# Patient Record
Sex: Female | Born: 2000 | Hispanic: Yes | Marital: Married | Smoking: Never smoker
Health system: Southern US, Community
[De-identification: ages and names within clinical notes are randomized; demographics above are authoritative.]

---

## 2019-11-29 DIAGNOSIS — Z8759 Personal history of other complications of pregnancy, childbirth and the puerperium: Secondary | ICD-10-CM | POA: Insufficient documentation

## 2020-09-28 DIAGNOSIS — Z3490 Encounter for supervision of normal pregnancy, unspecified, unspecified trimester: Secondary | ICD-10-CM | POA: Insufficient documentation

## 2021-03-15 ENCOUNTER — Encounter (HOSPITAL_COMMUNITY): Payer: Self-pay | Admitting: Emergency Medicine

## 2021-03-15 ENCOUNTER — Other Ambulatory Visit: Payer: Self-pay

## 2021-03-15 ENCOUNTER — Emergency Department (HOSPITAL_COMMUNITY): Payer: Medicaid Other

## 2021-03-15 ENCOUNTER — Inpatient Hospital Stay (HOSPITAL_COMMUNITY)
Admission: EM | Admit: 2021-03-15 | Discharge: 2021-03-17 | DRG: 818 | Disposition: A | Discharge status: Home / Self Care | Payer: Medicaid Other | Attending: Obstetrics and Gynecology | Admitting: Obstetrics and Gynecology

## 2021-03-15 DIAGNOSIS — Z7982 Long term (current) use of aspirin: Secondary | ICD-10-CM

## 2021-03-15 DIAGNOSIS — Z9181 History of falling: Secondary | ICD-10-CM

## 2021-03-15 DIAGNOSIS — W19XXXA Unspecified fall, initial encounter: Secondary | ICD-10-CM

## 2021-03-15 DIAGNOSIS — S82209A Unspecified fracture of shaft of unspecified tibia, initial encounter for closed fracture: Secondary | ICD-10-CM | POA: Diagnosis present

## 2021-03-15 DIAGNOSIS — W010XXA Fall on same level from slipping, tripping and stumbling without subsequent striking against object, initial encounter: Secondary | ICD-10-CM | POA: Diagnosis present

## 2021-03-15 DIAGNOSIS — Z20822 Contact with and (suspected) exposure to covid-19: Secondary | ICD-10-CM | POA: Diagnosis present

## 2021-03-15 DIAGNOSIS — T148XXA Other injury of unspecified body region, initial encounter: Secondary | ICD-10-CM

## 2021-03-15 DIAGNOSIS — Z419 Encounter for procedure for purposes other than remedying health state, unspecified: Secondary | ICD-10-CM

## 2021-03-15 DIAGNOSIS — O9A213 Injury, poisoning and certain other consequences of external causes complicating pregnancy, third trimester: Principal | ICD-10-CM | POA: Diagnosis present

## 2021-03-15 DIAGNOSIS — R109 Unspecified abdominal pain: Secondary | ICD-10-CM | POA: Diagnosis present

## 2021-03-15 DIAGNOSIS — S82143A Displaced bicondylar fracture of unspecified tibia, initial encounter for closed fracture: Secondary | ICD-10-CM

## 2021-03-15 DIAGNOSIS — S82145A Nondisplaced bicondylar fracture of left tibia, initial encounter for closed fracture: Secondary | ICD-10-CM | POA: Diagnosis present

## 2021-03-15 DIAGNOSIS — Z3A34 34 weeks gestation of pregnancy: Secondary | ICD-10-CM

## 2021-03-15 DIAGNOSIS — O368130 Decreased fetal movements, third trimester, not applicable or unspecified: Secondary | ICD-10-CM | POA: Diagnosis present

## 2021-03-15 LAB — ABO/RH: ABO/RH(D): B POS

## 2021-03-15 MED ORDER — ACETAMINOPHEN 500 MG PO TABS
1000.0000 mg | ORAL_TABLET | Freq: Once | ORAL | Status: AC
  Administered 2021-03-15: 1000 mg via ORAL
  Filled 2021-03-15: qty 2

## 2021-03-15 NOTE — ED Notes (Signed)
Trauma Response Nurse Note-  Reason for Call / Reason for Trauma activation:   - Level 2 Fall while being [redacted] weeks pregnant. With pain to the abdomen, right hip and left knee with decreased fetal movements.  Initial Focused Assessment (If applicable, or please see trauma documentation):  -Pt is alert and oriented. No airways obstruction noted, breathing is non-labored with symmetrical chest rise and fall. No external bleeding noted. GCS 15  Interventions:  -x-rays of the pelvic and left leg obtained. Imaging obtained at bedside. OB rapid responce at bedside. CT scans not ordered at this time.   Plan of Care as of this note:  -Waiting on results of x-rays. OB to continue to monitor  Event Summary:   - Pt came in as a fall, [redacted] weeks pregnant with pain to the right hip, left knee and abdomen. X-rays obtained. OB rapid response at bedside. No CT scans have been ordered as of  Now.   The Following (if applicable):    -MD notified: Dr. Karene Fry

## 2021-03-15 NOTE — ED Triage Notes (Signed)
Pt reported to ED after experiencing fall this evening. Reports pain to left knee and lower abdominal pain 6/10. Also reports decreased fetal movements since fall.

## 2021-03-15 NOTE — Progress Notes (Signed)
   03/15/21 2300  Clinical Encounter Type  Visited With Patient not available  Visit Type Trauma  Referral From Nurse  Consult/Referral To Chaplain  The chaplain responded to Trauma page. The patient is being assessed. No family is present. No chaplain services are needed at this time. The chaplain will follow up as needed.

## 2021-03-15 NOTE — ED Provider Notes (Signed)
[facility] EMERGENCY DEPARTMENT Provider Note   CSN: [id number] Arrival date & time: 03/15/21  2219     History Chief Complaint  Patient presents with   Fall   Abdominal Pain    Alexandra Cole is a 20 y.o. female.   Fall This is a new problem. The current episode started less than 1 hour ago. The problem occurs constantly. Associated symptoms include abdominal pain. Pertinent negatives include no chest pain, no headaches and no shortness of breath. She has tried nothing for the symptoms.  Abdominal Pain Pain location:  Generalized Pain quality: aching and sharp   Pain radiates to:  Does not radiate Pain severity:  Moderate Onset quality:  Sudden Timing:  Constant Chronicity:  New Associated symptoms: no chest pain, no chills, no cough, no dysuria, no fever, no hematuria, no shortness of breath, no sore throat, no vaginal bleeding and no vomiting    20 year old G1 P0 female at [redacted] weeks gestational age presenting to the emergency department after mechanical ground-level fall.  The patient states that she tripped and lost her balance while holding one of her children, falling onto her right side.  She denies any head trauma or loss of consciousness.  She states that she twisted her left knee when she fell and sustained immediate pain and swelling and subsequent difficulty with range of motion.  She has been unable to bear weight since the accident.  She was transported to [facility] health where she arrived GCS 15, ABC intact as a level 2 trauma.  She endorsed left knee pain, right hip pain and generalized abdominal pain.  OB rapid response was bedside for fetal heart tracing.  History reviewed. No pertinent past medical history.  Patient Active Problem List   Diagnosis Date Noted   Status post fall 03/16/2021   Supervision of normal pregnancy 09/28/2020   History of pre-eclampsia 11/29/2019     History reviewed. No pertinent surgical history.   OB  History     Gravida  2   Para      Term      Preterm      AB      Living  1      SAB      IAB      Ectopic      Multiple      Live Births              History reviewed. No pertinent family history.  Social History   Tobacco Use   Smoking status: Never   Smokeless tobacco: Never  Vaping Use   Vaping Use: Never used  Substance Use Topics   Alcohol use: Not Currently   Drug use: Not Currently    Home Medications Prior to Admission medications   Medication Sig Start Date End Date Taking? Authorizing Provider  aspirin 81 MG chewable tablet Chew 81 mg by mouth daily.   Yes [provider]  ondansetron (ZOFRAN) 4 MG tablet Take 4 mg by mouth every 8 (eight) hours as needed. 11/01/20   [provider]    Allergies    Patient has no allergy information on record.  Review of Systems   Review of Systems  Constitutional:  Negative for chills and fever.  HENT:  Negative for ear pain and sore throat.   Eyes:  Negative for pain and visual disturbance.  Respiratory:  Negative for cough and shortness of breath.   Cardiovascular:  Negative for chest pain and palpitations.  Gastrointestinal:  Positive for abdominal pain. Negative for vomiting.  Genitourinary:  Negative for dysuria, hematuria and vaginal bleeding.  Musculoskeletal:  Positive for arthralgias and joint swelling. Negative for back pain.  Skin:  Negative for color change and rash.  Neurological:  Negative for seizures, syncope and headaches.  All other systems reviewed and are negative.  Physical Exam Updated Vital Signs BP 113/64   Pulse 79   Temp 97.9 F (36.6 C) (Oral)   Resp 18   Ht 5\' 2"  (1.575 m)   Wt 55.8 kg   SpO2 97%   BMI 22.50 kg/m   Physical Exam Vitals and nursing note reviewed.  Constitutional:      General: She is not in acute distress.    Appearance: She is well-developed.  HENT:     Head: Normocephalic and atraumatic.     Comments: No midline  tenderness to the cervical spine, range of motion intact Eyes:     Conjunctiva/sclera: Conjunctivae normal.  Cardiovascular:     Rate and Rhythm: Normal rate and regular rhythm.     Heart sounds: No murmur heard. Pulmonary:     Effort: Pulmonary effort is normal. No respiratory distress.     Breath sounds: Normal breath sounds.  Abdominal:     Palpations: Abdomen is soft.     Tenderness: There is abdominal tenderness. There is no guarding or rebound.     Comments: Gravid uterus, generalized tenderness to palpation, worse in the epigastrium and at the uterine fundus  Musculoskeletal:        General: Swelling, tenderness and signs of injury present.     Cervical back: Neck supple.     Comments: No midline tenderness of the thoracic or lumbar spine.  Left knee with decreased range of motion, lateral tenderness to palpation with an obvious joint effusion present.  Distally neurovascularly intact.  Skin:    General: Skin is warm and dry.     Capillary Refill: Capillary refill takes less than 2 seconds.  Neurological:     General: No focal deficit present.     Mental Status: She is alert. Mental status is at baseline.    ED Results / Procedures / Treatments   Labs (all labs ordered are listed, but only abnormal results are displayed) Labs Reviewed  CBC - Abnormal; Notable for the following components:      Result Value   RBC 3.54 (*)    Hemoglobin 10.3 (*)    HCT 30.1 (*)    All other components within normal limits  RESP PANEL BY RT-PCR (FLU A&B, COVID) ARPGX2  TYPE AND SCREEN  ABO/RH    EKG None  Radiology DG Knee 1-2 Views Left  Result Date: 03/16/2021 CLINICAL DATA:  Fall this evening.  Left knee pain. EXAM: LEFT KNEE - 1-2 VIEW COMPARISON:  Tibia and femur radiograph earlier today. FINDINGS: Again seen depressed fracture of the lateral tibial plateau. Approximately 4-5 mm articular depression laterally. Fracture extends to the tibial spines. No convincing metaphyseal  component. There is a large lipohemarthrosis. IMPRESSION: Depressed lateral tibial plateau fracture with approximately 4-5 mm articular depression. Electronically Signed   By: 03/18/2021 M.D.   On: 03/16/2021 01:12   CT KNEE LEFT WO CONTRAST  Result Date: 03/16/2021 CLINICAL DATA:  Left knee fracture. EXAM: CT OF THE LEFT KNEE WITHOUT CONTRAST TECHNIQUE: Multidetector CT imaging of the LEFT knee was performed according to the standard protocol. Multiplanar CT image reconstructions were also generated. COMPARISON:  03/16/2021 RADIOGRAPHS FINDINGS:  Bones/Joint/Cartilage There is a split depression fracture of the lateral tibial plateau with up to 4 mm of articular surface step-off along the lateral fracture margin. Fracture line extends into the intercondylar eminence. The split fracture component is seen extending into the proximal tibiofibular joint. Additional subcortical impaction fracture of the medial tibial plateau is noted as well. Distal femur is intact. Lipohemarthrosis is noted. Ligaments Suboptimally assessed by CT. Thickening and hazy stranding about the ACL likely reflecting some degree of tearing. PCL appears intact though the tibial attachment site is closely approximated by the tibial plateau fracture line. Thickening, stranding and laxity along the lateral supporting structures. Medial supporting structures appear intact with some minimal bowing secondary to fluid in the medial recess. Muscles and Tendons Anterior bowing of the extensor mechanism secondary to the presence of the effusion and lipohemarthrosis. Extensor mechanism otherwise remains grossly intact. No discontinuity along the medial or lateral retinaculum. No other conspicuous musculotendinous abnormalities are seen. Soft tissues Soft tissue swelling of the knee. Effusion lipohemarthrosis as above. IMPRESSION: Split depressed fracture of the lateral tibial plateau with extension into the intercondylar eminence and fracture line  extension into the proximal tibiofibular joint. Additional subcortical impaction fracture of the medial tibial plateau. Lipohemarthrosis. Thickening, laxity of the lateral supporting structures of the knee concerning for ligamentous injury. Additional stranding and irregularity of the ACL. PCL appears intact though the tibial attachment is closely approximated by the fracture line. Electronically Signed   By: Price  DeHay M.D.   On: 03/16/2021 02:47   DGKreg [place] Pelvis Portable  Result Date: 03/15/2021 CLINICAL DATA:  20 year old female with trauma. EXAM: LEFT FEMUR PORTABLE 1 VIEW; PORTABLE LEFT TIBIA AND FIBULA - 2 VIEW; PORTABLE PELVIS 1-2 VIEWS COMPARISON:  None. FINDINGS: There is a depressed fracture of the lateral tibial plateau. No other acute fracture. There is no dislocation. There is a suprapatellar lipohemarthrosis. No arthritic changes. Fetal skeleton over the lower abdomen and pelvis in cephalic position. IMPRESSION: Depressed fracture of the lateral tibial plateau. No dislocation. Electronically Signed   By: Elgie CollardArash  Radparvar M.D.   On: 03/15/2021 23:33   DG Tibia/Fibula Left Port  Result Date: 03/15/2021 CLINICAL DATA:  20 year old female with trauma. EXAM: LEFT FEMUR PORTABLE 1 VIEW; PORTABLE LEFT TIBIA AND FIBULA - 2 VIEW; PORTABLE PELVIS 1-2 VIEWS COMPARISON:  None. FINDINGS: There is a depressed fracture of the lateral tibial plateau. No other acute fracture. There is no dislocation. There is a suprapatellar lipohemarthrosis. No arthritic changes. Fetal skeleton over the lower abdomen and pelvis in cephalic position. IMPRESSION: Depressed fracture of the lateral tibial plateau. No dislocation. Electronically Signed   By: Elgie CollardArash  Radparvar M.D.   On: 03/15/2021 23:33   DG FEMUR PORT 1V LEFT  Result Date: 03/15/2021 CLINICAL DATA:  [age] year old female with trauma. EXAM: LEFT FEMUR PORTABLE 1 VIEW; PORTABLE LEFT TIBIA AND FIBULA - 2 VIEW; PORTABLE PELVIS 1-2 VIEWS COMPARISON:  None. FINDINGS: There  is a depressed fracture of the lateral tibial plateau. No other acute fracture. There is no dislocation. There is a suprapatellar lipohemarthrosis. No arthritic changes. Fetal skeleton over the lower abdomen and pelvis in cephalic position. IMPRESSION: Depressed fracture of the lateral tibial plateau. No dislocation. Electronically Signed   By: Elgie CollardArash  Radparvar M.D.   On: 03/15/2021 23:33    Procedures Procedures   Medications Ordered in ED Medications  aspirin chewable tablet 81 mg ( Oral Automatically Held 03/24/21 1000)  sodium chloride flush (NS) 0.9 % injection 3 mL ( Intravenous Automatically Held 03/24/21  2200)  sodium chloride flush (NS) 0.9 % injection 3 mL ( Intravenous MAR Hold 03/16/21 1051)  0.9 %  sodium chloride infusion ( Intravenous MAR Hold 03/16/21 1051)  acetaminophen (TYLENOL) tablet 650 mg ( Oral MAR Hold 03/16/21 1051)  zolpidem (AMBIEN) tablet 5 mg ( Oral MAR Hold 03/16/21 1051)  docusate sodium (COLACE) capsule 100 mg ( Oral Automatically Held 03/24/21 1000)  prenatal multivitamin tablet 1 tablet ( Oral Automatically Held 03/24/21 1200)  calcium carbonate (TUMS - dosed in mg elemental calcium) chewable tablet 400 mg of elemental calcium ( Oral MAR Hold 03/16/21 1051)  oxyCODONE (Oxy IR/ROXICODONE) immediate release tablet 5 mg ( Oral MAR Hold 03/16/21 1051)  HYDROmorphone (DILAUDID) injection 0.5 mg ( Intravenous MAR Hold 03/16/21 1051)  lactated ringers infusion ( Intravenous New Bag/Given 03/16/21 1114)  lactated ringers infusion (has no administration in time range)  chlorhexidine (PERIDEX) 0.12 % solution (has no administration in time range)  0.9 % irrigation (POUR BTL) (1,000 mLs Irrigation Given 03/16/21 1226)  acetaminophen (TYLENOL) tablet 1,000 mg (1,000 mg Oral Given 03/15/21 2326)  sodium citrate-citric acid (ORACIT) solution 30 mL (30 mLs Oral Given 03/16/21 1113)  chlorhexidine (PERIDEX) 0.12 % solution 15 mL (15 mLs Mouth/Throat Given 03/16/21 1114)    Or  MEDLINE mouth  rinse ( Mouth Rinse See Alternative 03/16/21 1114)    ED Course  I have reviewed the triage vital signs and the nursing notes.  Pertinent labs & imaging results that were available during my care of the patient were reviewed by me and considered in my medical decision making (see chart for details).    MDM Rules/Calculators/A&P                           20 year old G1 P0 female at [redacted] weeks gestational age presenting to the emergency department after a mechanical ground-level fall.  She presented as a level 2 trauma and arrived GCS 15, ABC intact.  Fetal heart tracing was performed on arrival with fetal heart tones noted in the 150s.  The patient describes right hip pain, left knee pain with joint swelling and decreased range of motion, generalized abdominal discomfort.  No vaginal bleeding.  The left lower extremity was found to be neurovascularly intact.  Concern for possible fracture.  Will obtain x-ray imaging and reassess.  Since abdomen was gravid, mildly tender to palpation but nonperitoneal take.  We will hold off on CT imaging given the patient's hemodynamic stability.  X-ray imaging concerning for a lateral tibial plateau fracture.  Orthopedic consulted for further recommendations.  At time of signout, orthopedics recommendations were pending.  Plan for likely admission for serial abdominal exams in the setting of abdominal trauma and intrauterine pregnancy at [redacted] weeks gestational age.  Signout provided to Dr. Nicanor Alcon at [phone].  Final Clinical Impression(s) / ED Diagnoses Final diagnoses:  Fall  Closed fracture of tibial plateau, unspecified laterality, initial encounter  [redacted] weeks gestation of pregnancy    Rx / DC Orders ED Discharge Orders     None        Ernie Avena, MD 03/16/21 1240

## 2021-03-15 NOTE — Progress Notes (Signed)
[facility] RN called to [facility] for 20 yo G2P1 GA34.1, Level 2 Trauma s/p fall and hit stomach. Patient complains of abdominal pain, pelvic pain and left leg pain.   [facility] RN arrived and applied u/s monitor to patiens abdomen, FHR 155. Patients denies any sharp pain TO URQ, delines headache, blurry vision, seeing spots in front of eyes. Patient denies any vaginal bleeding or leaking of fluid. Patients abdomen is soft to palpation.   Lovenia Shuck, RN

## 2021-03-15 NOTE — Progress Notes (Signed)
Pollux.Rosser RN spoke with Dr. Almira Bar Attending and provided report and triage information. Orders received that fetus is to be monitored for at least 4 hours. As long as patient is cleared from main ED, ok to move patient to MAU for monitoring.   Lovenia Shuck, RN

## 2021-03-15 NOTE — Progress Notes (Signed)
Orthopedic Tech Progress Note Patient Details:  Alexandra Cole [birth year] [id number]  Level 2 trauma   Patient ID: Alexandra Cole, female   DOB: 2001-01-11, 20 y.o.   MRN: [id number]  Donald Pore 03/15/2021, 10:44 PM

## 2021-03-16 ENCOUNTER — Observation Stay (HOSPITAL_COMMUNITY): Payer: Medicaid Other

## 2021-03-16 ENCOUNTER — Emergency Department (HOSPITAL_COMMUNITY): Payer: Medicaid Other

## 2021-03-16 ENCOUNTER — Observation Stay (HOSPITAL_COMMUNITY): Payer: Medicaid Other | Admitting: Certified Registered"

## 2021-03-16 ENCOUNTER — Encounter (HOSPITAL_COMMUNITY)
Admission: EM | Disposition: A | Discharge status: Home / Self Care | Payer: Self-pay | Source: Home / Self Care | Attending: Family Medicine

## 2021-03-16 ENCOUNTER — Encounter (HOSPITAL_COMMUNITY): Payer: Self-pay | Admitting: Emergency Medicine

## 2021-03-16 DIAGNOSIS — O9A213 Injury, poisoning and certain other consequences of external causes complicating pregnancy, third trimester: Secondary | ICD-10-CM | POA: Diagnosis not present

## 2021-03-16 DIAGNOSIS — Z20822 Contact with and (suspected) exposure to covid-19: Secondary | ICD-10-CM | POA: Diagnosis present

## 2021-03-16 DIAGNOSIS — Z3493 Encounter for supervision of normal pregnancy, unspecified, third trimester: Secondary | ICD-10-CM | POA: Diagnosis not present

## 2021-03-16 DIAGNOSIS — S82142A Displaced bicondylar fracture of left tibia, initial encounter for closed fracture: Secondary | ICD-10-CM

## 2021-03-16 DIAGNOSIS — Z09 Encounter for follow-up examination after completed treatment for conditions other than malignant neoplasm: Secondary | ICD-10-CM | POA: Diagnosis not present

## 2021-03-16 DIAGNOSIS — O368130 Decreased fetal movements, third trimester, not applicable or unspecified: Secondary | ICD-10-CM | POA: Diagnosis present

## 2021-03-16 DIAGNOSIS — Z9181 History of falling: Secondary | ICD-10-CM

## 2021-03-16 DIAGNOSIS — W010XXA Fall on same level from slipping, tripping and stumbling without subsequent striking against object, initial encounter: Secondary | ICD-10-CM | POA: Diagnosis present

## 2021-03-16 DIAGNOSIS — Z7982 Long term (current) use of aspirin: Secondary | ICD-10-CM | POA: Diagnosis not present

## 2021-03-16 DIAGNOSIS — Z3A34 34 weeks gestation of pregnancy: Secondary | ICD-10-CM

## 2021-03-16 DIAGNOSIS — Z9889 Other specified postprocedural states: Secondary | ICD-10-CM | POA: Diagnosis not present

## 2021-03-16 DIAGNOSIS — S82145A Nondisplaced bicondylar fracture of left tibia, initial encounter for closed fracture: Secondary | ICD-10-CM | POA: Diagnosis present

## 2021-03-16 DIAGNOSIS — Z3480 Encounter for supervision of other normal pregnancy, unspecified trimester: Secondary | ICD-10-CM | POA: Diagnosis not present

## 2021-03-16 DIAGNOSIS — R109 Unspecified abdominal pain: Secondary | ICD-10-CM | POA: Diagnosis not present

## 2021-03-16 DIAGNOSIS — W1839XA Other fall on same level, initial encounter: Secondary | ICD-10-CM | POA: Diagnosis not present

## 2021-03-16 DIAGNOSIS — S82143A Displaced bicondylar fracture of unspecified tibia, initial encounter for closed fracture: Secondary | ICD-10-CM

## 2021-03-16 HISTORY — PX: ORIF TIBIA PLATEAU: SHX2132

## 2021-03-16 LAB — RESP PANEL BY RT-PCR (FLU A&B, COVID) ARPGX2
Influenza A by PCR: NEGATIVE
Influenza B by PCR: NEGATIVE
SARS Coronavirus 2 by RT PCR: NEGATIVE

## 2021-03-16 LAB — TYPE AND SCREEN
ABO/RH(D): B POS
Antibody Screen: NEGATIVE

## 2021-03-16 LAB — CBC
HCT: 30.1 % — ABNORMAL LOW (ref 36.0–46.0)
Hemoglobin: 10.3 g/dL — ABNORMAL LOW (ref 12.0–15.0)
MCH: 29.1 pg (ref 26.0–34.0)
MCHC: 34.2 g/dL (ref 30.0–36.0)
MCV: 85 fL (ref 80.0–100.0)
Platelets: 172 10*3/uL (ref 150–400)
RBC: 3.54 MIL/uL — ABNORMAL LOW (ref 3.87–5.11)
RDW: 13.3 % (ref 11.5–15.5)
WBC: 10.5 10*3/uL (ref 4.0–10.5)
nRBC: 0 % (ref 0.0–0.2)

## 2021-03-16 SURGERY — OPEN REDUCTION INTERNAL FIXATION (ORIF) TIBIAL PLATEAU
Anesthesia: Spinal | Laterality: Left

## 2021-03-16 MED ORDER — DOCUSATE SODIUM 100 MG PO CAPS
100.0000 mg | ORAL_CAPSULE | Freq: Every day | ORAL | Status: DC
  Administered 2021-03-17: 100 mg via ORAL
  Filled 2021-03-16: qty 1

## 2021-03-16 MED ORDER — VANCOMYCIN HCL 1000 MG IV SOLR
INTRAVENOUS | Status: DC | PRN
  Administered 2021-03-16: 1000 mg

## 2021-03-16 MED ORDER — VANCOMYCIN HCL 1000 MG IV SOLR
INTRAVENOUS | Status: AC
  Filled 2021-03-16: qty 1000

## 2021-03-16 MED ORDER — SODIUM CHLORIDE 0.9% FLUSH: 3.0000 mL | INTRAVENOUS | Status: DC | PRN

## 2021-03-16 MED ORDER — LIDOCAINE 2% (20 MG/ML) 5 ML SYRINGE
INTRAMUSCULAR | Status: AC
  Filled 2021-03-16: qty 5

## 2021-03-16 MED ORDER — CEFAZOLIN SODIUM-DEXTROSE 2-3 GM-%(50ML) IV SOLR
INTRAVENOUS | Status: DC | PRN
  Administered 2021-03-16: 2 g via INTRAVENOUS

## 2021-03-16 MED ORDER — LIDOCAINE HCL (CARDIAC) PF 100 MG/5ML IV SOSY
PREFILLED_SYRINGE | INTRAVENOUS | Status: DC | PRN
  Administered 2021-03-16: 50 mg via INTRATRACHEAL

## 2021-03-16 MED ORDER — BUPIVACAINE IN DEXTROSE 0.75-8.25 % IT SOLN
INTRATHECAL | Status: DC | PRN
  Administered 2021-03-16: 1.6 mL via INTRATHECAL

## 2021-03-16 MED ORDER — ONDANSETRON HCL 4 MG/2ML IJ SOLN: 4.0000 mg | Freq: Once | INTRAMUSCULAR | Status: DC | PRN

## 2021-03-16 MED ORDER — CALCIUM CARBONATE ANTACID 500 MG PO CHEW: 2.0000 | CHEWABLE_TABLET | ORAL | Status: DC | PRN

## 2021-03-16 MED ORDER — FENTANYL CITRATE (PF) 250 MCG/5ML IJ SOLN
INTRAMUSCULAR | Status: DC | PRN
  Administered 2021-03-16: 50 ug via INTRAVENOUS

## 2021-03-16 MED ORDER — BUPIVACAINE HCL (PF) 0.25 % IJ SOLN
INTRAMUSCULAR | Status: AC
  Filled 2021-03-16: qty 30

## 2021-03-16 MED ORDER — LACTATED RINGERS IV SOLN
INTRAVENOUS | Status: DC | PRN
Start: 2021-03-16 — End: 2021-03-16

## 2021-03-16 MED ORDER — OXYCODONE HCL 5 MG PO TABS
5.0000 mg | ORAL_TABLET | ORAL | Status: DC | PRN
Start: 2021-03-16 — End: 2021-03-17
  Administered 2021-03-16 – 2021-03-17 (×6): 5 mg via ORAL
  Filled 2021-03-16 (×6): qty 1

## 2021-03-16 MED ORDER — OXYCODONE HCL 5 MG PO TABS: 5.0000 mg | ORAL_TABLET | Freq: Once | ORAL | Status: DC | PRN

## 2021-03-16 MED ORDER — ONDANSETRON HCL 4 MG/2ML IJ SOLN
INTRAMUSCULAR | Status: DC | PRN
  Administered 2021-03-16: 4 mg via INTRAVENOUS

## 2021-03-16 MED ORDER — FENTANYL CITRATE (PF) 100 MCG/2ML IJ SOLN
INTRAMUSCULAR | Status: AC
  Filled 2021-03-16: qty 2

## 2021-03-16 MED ORDER — ORAL CARE MOUTH RINSE: 15.0000 mL | Freq: Once | OROMUCOSAL | Status: AC

## 2021-03-16 MED ORDER — CHLORHEXIDINE GLUCONATE 0.12 % MT SOLN
15.0000 mL | Freq: Once | OROMUCOSAL | Status: AC
  Administered 2021-03-16: 15 mL via OROMUCOSAL

## 2021-03-16 MED ORDER — PROPOFOL 500 MG/50ML IV EMUL
INTRAVENOUS | Status: DC | PRN
  Administered 2021-03-16: 150 ug/kg/min via INTRAVENOUS

## 2021-03-16 MED ORDER — PHENYLEPHRINE HCL-NACL 10-0.9 MG/250ML-% IV SOLN
INTRAVENOUS | Status: DC | PRN
  Administered 2021-03-16: 50 ug/min via INTRAVENOUS

## 2021-03-16 MED ORDER — ZOLPIDEM TARTRATE 5 MG PO TABS: 5.0000 mg | ORAL_TABLET | Freq: Every evening | ORAL | Status: DC | PRN

## 2021-03-16 MED ORDER — FENTANYL CITRATE (PF) 100 MCG/2ML IJ SOLN
25.0000 ug | INTRAMUSCULAR | Status: DC | PRN
  Administered 2021-03-16: 25 ug via INTRAVENOUS

## 2021-03-16 MED ORDER — LACTATED RINGERS IV SOLN: INTRAVENOUS | Status: DC

## 2021-03-16 MED ORDER — 0.9 % SODIUM CHLORIDE (POUR BTL) OPTIME
TOPICAL | Status: DC | PRN
  Administered 2021-03-16: 1000 mL

## 2021-03-16 MED ORDER — ACETAMINOPHEN 325 MG PO TABS
650.0000 mg | ORAL_TABLET | ORAL | Status: DC | PRN
  Administered 2021-03-16: 650 mg via ORAL
  Filled 2021-03-16: qty 2

## 2021-03-16 MED ORDER — HYDROMORPHONE HCL 1 MG/ML IJ SOLN
0.5000 mg | INTRAMUSCULAR | Status: DC | PRN
Start: 2021-03-16 — End: 2021-03-17
  Administered 2021-03-16 – 2021-03-17 (×6): 0.5 mg via INTRAVENOUS
  Filled 2021-03-16 (×6): qty 0.5

## 2021-03-16 MED ORDER — CHLORHEXIDINE GLUCONATE 0.12 % MT SOLN
OROMUCOSAL | Status: AC
  Filled 2021-03-16: qty 15

## 2021-03-16 MED ORDER — OXYCODONE HCL 5 MG/5ML PO SOLN
5.0000 mg | Freq: Once | ORAL | Status: DC | PRN
Start: 2021-03-16 — End: 2021-03-16

## 2021-03-16 MED ORDER — ONDANSETRON HCL 4 MG/2ML IJ SOLN
INTRAMUSCULAR | Status: AC
  Filled 2021-03-16: qty 2

## 2021-03-16 MED ORDER — ENOXAPARIN SODIUM 40 MG/0.4ML IJ SOSY
40.0000 mg | PREFILLED_SYRINGE | INTRAMUSCULAR | Status: DC
  Administered 2021-03-17: 40 mg via SUBCUTANEOUS
  Filled 2021-03-16 (×2): qty 0.4

## 2021-03-16 MED ORDER — ASPIRIN 81 MG PO CHEW
81.0000 mg | CHEWABLE_TABLET | Freq: Every day | ORAL | Status: DC
  Administered 2021-03-17: 81 mg via ORAL
  Filled 2021-03-16: qty 1

## 2021-03-16 MED ORDER — PRENATAL MULTIVITAMIN CH
1.0000 | ORAL_TABLET | Freq: Every day | ORAL | Status: DC
  Administered 2021-03-17: 1 via ORAL
  Filled 2021-03-16: qty 1

## 2021-03-16 MED ORDER — SODIUM CHLORIDE 0.9 % IV SOLN: 250.0000 mL | INTRAVENOUS | Status: DC | PRN

## 2021-03-16 MED ORDER — SOD CITRATE-CITRIC ACID 500-334 MG/5ML PO SOLN
30.0000 mL | Freq: Once | ORAL | Status: AC
  Administered 2021-03-16: 30 mL via ORAL
  Filled 2021-03-16: qty 30

## 2021-03-16 MED ORDER — FENTANYL CITRATE (PF) 250 MCG/5ML IJ SOLN
INTRAMUSCULAR | Status: AC
  Filled 2021-03-16: qty 5

## 2021-03-16 MED ORDER — SODIUM CHLORIDE 0.9% FLUSH
3.0000 mL | Freq: Two times a day (BID) | INTRAVENOUS | Status: DC
  Administered 2021-03-16 – 2021-03-17 (×3): 3 mL via INTRAVENOUS

## 2021-03-16 MED ORDER — CEFAZOLIN SODIUM 1 G IJ SOLR
INTRAMUSCULAR | Status: AC
  Filled 2021-03-16: qty 20

## 2021-03-16 SURGICAL SUPPLY — 72 items
BAG COUNTER SPONGE SURGICOUNT (BAG) ×2 IMPLANT
BANDAGE ESMARK 6X9 LF (GAUZE/BANDAGES/DRESSINGS) ×2 IMPLANT
BIT DRILL 2.5MM SMALL QC EVOS (BIT) ×1 IMPLANT
BIT DRILL QC 2.5MM SHRT EVO SM (DRILL) ×1 IMPLANT
BLADE CLIPPER SURG (BLADE) IMPLANT
BNDG COHESIVE 6X5 TAN STRL LF (GAUZE/BANDAGES/DRESSINGS) ×2 IMPLANT
BNDG ELASTIC 6X10 VLCR STRL LF (GAUZE/BANDAGES/DRESSINGS) ×2 IMPLANT
BNDG ELASTIC 6X5.8 VLCR STR LF (GAUZE/BANDAGES/DRESSINGS) ×2 IMPLANT
BNDG ESMARK 6X9 LF (GAUZE/BANDAGES/DRESSINGS) ×4
COOLER ICEMAN CLASSIC (MISCELLANEOUS) ×2 IMPLANT
COVER SURGICAL LIGHT HANDLE (MISCELLANEOUS) ×2 IMPLANT
DRAPE C-ARM 42X72 X-RAY (DRAPES) ×2 IMPLANT
DRAPE C-ARMOR (DRAPES) ×2 IMPLANT
DRAPE HALF SHEET 40X57 (DRAPES) ×2 IMPLANT
DRAPE IMP U-DRAPE 54X76 (DRAPES) ×4 IMPLANT
DRAPE INCISE IOBAN 66X45 STRL (DRAPES) ×2 IMPLANT
DRAPE ORTHO SPLIT 77X108 STRL (DRAPES) ×2
DRAPE SURG ORHT 6 SPLT 77X108 (DRAPES) ×2 IMPLANT
DRAPE U-SHAPE 47X51 STRL (DRAPES) ×2 IMPLANT
DRILL 2.5MM SMALL QC EVOS (BIT) ×2
DRILL QC 2.5MM SHORT EVOS SM (DRILL) ×2
DRSG PAD ABDOMINAL 8X10 ST (GAUZE/BANDAGES/DRESSINGS) ×2 IMPLANT
DRSG XEROFORM 1X8 (GAUZE/BANDAGES/DRESSINGS) ×2 IMPLANT
DURAPREP 26ML APPLICATOR (WOUND CARE) ×2 IMPLANT
ELECT REM PT RETURN 9FT ADLT (ELECTROSURGICAL) ×2
ELECTRODE REM PT RTRN 9FT ADLT (ELECTROSURGICAL) ×1 IMPLANT
FACESHIELD STD STERILE (MASK) ×2 IMPLANT
GAUZE SPONGE 4X4 12PLY STRL LF (GAUZE/BANDAGES/DRESSINGS) ×2 IMPLANT
GAUZE XEROFORM 1X8 LF (GAUZE/BANDAGES/DRESSINGS) ×2 IMPLANT
GLOVE SURG LTX SZ7 (GLOVE) ×2 IMPLANT
GLOVE SURG NEOP MICRO LF SZ7.5 (GLOVE) ×2 IMPLANT
GLOVE SURG SYN 7.5  E (GLOVE) ×1
GLOVE SURG SYN 7.5 E (GLOVE) ×1 IMPLANT
GLOVE SURG UNDER POLY LF SZ7 (GLOVE) ×10 IMPLANT
GOWN STRL REIN XL XLG (GOWN DISPOSABLE) ×6 IMPLANT
K-WIRE 1.6 (WIRE) ×2
K-WIRE FX150X1.6XTROC PNT (WIRE) ×2
KIT BASIN OR (CUSTOM PROCEDURE TRAY) ×2 IMPLANT
KIT TURNOVER KIT B (KITS) ×2 IMPLANT
KWIRE FX150X1.6XTROC PNT (WIRE) ×2 IMPLANT
MANIFOLD NEPTUNE II (INSTRUMENTS) ×2 IMPLANT
NDL SUT 6 .5 CRC .975X.05 MAYO (NEEDLE) IMPLANT
NEEDLE HYPO 25GX1X1/2 BEV (NEEDLE) ×2 IMPLANT
NEEDLE MAYO TAPER (NEEDLE)
NS IRRIG 1000ML POUR BTL (IV SOLUTION) ×2 IMPLANT
PACK GENERAL/GYN (CUSTOM PROCEDURE TRAY) ×2 IMPLANT
PAD ABD 8X10 STRL (GAUZE/BANDAGES/DRESSINGS) ×2 IMPLANT
PAD ARMBOARD 7.5X6 YLW CONV (MISCELLANEOUS) ×4 IMPLANT
PAD COLD SHLDR WRAP-ON (PAD) ×2 IMPLANT
PLATE LPT EVOS 3.5X70 4H LT (Plate) ×2 IMPLANT
PUTTY DBX 2.5CC (Putty) ×2 IMPLANT
PUTTY DBX 2.5CC DEPUY (Putty) ×1 IMPLANT
SCREW CANC PT EVOS 4.7X65 (Screw) ×2 IMPLANT
SCREW CTX 3.5X34MM EVOS (Screw) ×2 IMPLANT
SCREW CTX 3.5X36MM EVOS (Screw) ×2 IMPLANT
SCREW LOCK 3.5X60MM EVOS (Screw) ×4 IMPLANT
SCREW LOCK 3.5X65MM (Screw) ×4 IMPLANT
STAPLER VISISTAT 35W (STAPLE) IMPLANT
STOCKINETTE IMPERVIOUS 9X36 MD (GAUZE/BANDAGES/DRESSINGS) ×2 IMPLANT
SUCTION FRAZIER HANDLE 10FR (MISCELLANEOUS) ×1
SUCTION TUBE FRAZIER 10FR DISP (MISCELLANEOUS) ×1 IMPLANT
SUT ETHILON 2 0 FS 18 (SUTURE) ×2 IMPLANT
SUT ETHILON 3 0 PS 1 (SUTURE) ×4 IMPLANT
SUT PDS AB 0 CT 36 (SUTURE) ×4 IMPLANT
SUT VIC AB 0 CT1 27 (SUTURE) ×1
SUT VIC AB 0 CT1 27XBRD ANBCTR (SUTURE) ×1 IMPLANT
SUT VIC AB 2-0 CT1 27 (SUTURE) ×3
SUT VIC AB 2-0 CT1 TAPERPNT 27 (SUTURE) ×3 IMPLANT
SYR 10ML LL (SYRINGE) ×2 IMPLANT
SYR CONTROL 10ML LL (SYRINGE) ×2 IMPLANT
TOWEL GREEN STERILE (TOWEL DISPOSABLE) ×4 IMPLANT
TOWEL GREEN STERILE FF (TOWEL DISPOSABLE) ×2 IMPLANT

## 2021-03-16 NOTE — Progress Notes (Signed)
Orthopedic Tech Progress Note Patient Details:  Alexandra Cole [birth year] [id number]  Ortho Devices Type of Ortho Device: Knee Immobilizer Ortho Device/Splint Location: LLE Ortho Device/Splint Interventions: Ordered, Application, Adjustment   Post Interventions Patient Tolerated: Well Instructions Provided: Adjustment of device, Care of device, Poper ambulation with device  Alexandra Cole 03/16/2021, 3:15 AM

## 2021-03-16 NOTE — Anesthesia Procedure Notes (Signed)
Spinal  Patient location during procedure: OR Start time: 03/16/2021 11:50 AM End time: 03/16/2021 11:55 AM Reason for block: surgical anesthesia Staffing Performed: anesthesiologist  Anesthesiologist: Mellody Dance, MD Preanesthetic Checklist Completed: patient identified, IV checked, risks and benefits discussed, surgical consent, monitors and equipment checked, pre-op evaluation and timeout performed Spinal Block Patient position: sitting Prep: DuraPrep Patient monitoring: cardiac monitor, continuous pulse ox and blood pressure Approach: midline Injection technique: single-shot Needle Needle type: Pencan  Needle gauge: 24 G Needle length: 9 cm Assessment Events: CSF return Additional Notes Functioning IV was confirmed and monitors were applied. Sterile prep and drape, including hand hygiene and sterile gloves were used. The patient was positioned and the spine was prepped. The skin was anesthetized with lidocaine.  Free flow of clear CSF was obtained prior to injecting local anesthetic into the CSF.  The spinal needle aspirated freely following injection.  The needle was carefully withdrawn.  The patient tolerated the procedure well.

## 2021-03-16 NOTE — Progress Notes (Signed)
[facility] RN contacted Dr. Shawnie Pons with update to patients trauma diagnosis and status.   Lovenia Shuck, RN

## 2021-03-16 NOTE — Discharge Instructions (Signed)

## 2021-03-16 NOTE — H&P (Signed)
Alexandra Cole is an 20 y.o. G2P0 [redacted]w[redacted]d female.   Chief Complaint: s/p fall HPI: patient fell from standing while carrying her 20 year old onto her knee. Noted pain and decreased FM since that time. Found to have a tibial plateau fracture requiring surgical repair. Also needed 4 hours of monitoring due to fall to abdomen.  History reviewed. No pertinent past medical history.  History reviewed. No pertinent surgical history.  History reviewed. No pertinent family history. Social History:  has no history on file for tobacco use, alcohol use, and drug use.   Not on File  Medications Prior to Admission  Medication Sig Dispense Refill   aspirin 81 MG chewable tablet Chew 81 mg by mouth daily.     ondansetron (ZOFRAN) 4 MG tablet Take 4 mg by mouth every 8 (eight) hours as needed.       A comprehensive review of systems was negative.  Blood pressure 106/65, pulse 80, temperature 98 F (36.7 C), temperature source Oral, resp. rate 19, height 5\' 2"  (1.575 m), weight 55.8 kg, SpO2 95 %. General appearance: alert, cooperative, and appears stated age Head: Normocephalic, without obvious abnormality, atraumatic Neck: supple, symmetrical, trachea midline Lungs:  normal effort Heart: regular rate and rhythm Abdomen:  gravid, non-tender Extremities:  left knee in immobilizer Skin: Skin color, texture, turgor normal. No rashes or lesions Neurologic: Grossly normal   Lab Results  Component Value Date   WBC 10.5 03/16/2021   HGB 10.3 (L) 03/16/2021   HCT 30.1 (L) 03/16/2021   MCV 85.0 03/16/2021   PLT 172 03/16/2021           ABO, Rh: --/--/B POS Performed at [facility], [address] [place], [place]  [phone])  Antibody: NEG (07/28 2244)  Rubella:   Immune RPR:   NR HBsAg:   Neg Hep C: NR HIV:   Neg GBS:    1 hour: 99      Assessment/Plan Active Problems:   Status post fall IUP @ 34 weeks Left knee fracture  Plan for monitoring Having  some contractions will need 24 hours of monitoring NPO for potential surgery Ortho to see patient in am.   06-14-1979 03/16/2021, 5:32 AM

## 2021-03-16 NOTE — Progress Notes (Signed)
[facility] RN and ED Tech transported patient via stretcher, family member followed [place], to [facility].   Lovenia Shuck, RN

## 2021-03-16 NOTE — Consult Note (Signed)
ORTHOPAEDIC CONSULTATION  REQUESTING PHYSICIAN: Reva Bores, MD  Chief Complaint: Left tibial plateau fracture  HPI: Alexandra Cole is a 20 y.o. female who presents with left tibial plateau fracture after mechanical fall at home.  She thinks she may have tripped on her slippers while she was walking at home carrying her child.  She fell directly on her abdomen and injured her left leg.  She was brought in overnight onto the [facility] service for monitoring of the fetus.  She reports mild pain to the left knee.  There were no events with the patient or the fetus overnight.    History reviewed. No pertinent past medical history. History reviewed. No pertinent surgical history. Social History   Socioeconomic History   Marital status: Married    Spouse name: Not on file   Number of children: Not on file   Years of education: Not on file   Highest education level: Not on file  Occupational History   Not on file  Tobacco Use   Smoking status: Not on file   Smokeless tobacco: Not on file  Substance and Sexual Activity   Alcohol use: Not on file   Drug use: Not on file   Sexual activity: Not on file  Other Topics Concern   Not on file  Social History Narrative   Not on file   Social Determinants of Health   Financial Resource Strain: Not on file  Food Insecurity: Not on file  Transportation Needs: Not on file  Physical Activity: Not on file  Stress: Not on file  Social Connections: Not on file   History reviewed. No pertinent family history. - negative except otherwise stated in the family history section Not on File Prior to Admission medications   Medication Sig Start Date End Date Taking? Authorizing Provider  aspirin 81 MG chewable tablet Chew 81 mg by mouth daily.    [provider]  ondansetron (ZOFRAN) 4 MG tablet Take 4 mg by mouth every 8 (eight) hours as needed. 11/01/20   [provider]   DG Knee 1-2 Views Left  Result Date:  03/16/2021 CLINICAL DATA:  Fall this evening.  Left knee pain. EXAM: LEFT KNEE - 1-2 VIEW COMPARISON:  Tibia and femur radiograph earlier today. FINDINGS: Again seen depressed fracture of the lateral tibial plateau. Approximately 4-5 mm articular depression laterally. Fracture extends to the tibial spines. No convincing metaphyseal component. There is a large lipohemarthrosis. IMPRESSION: Depressed lateral tibial plateau fracture with approximately 4-5 mm articular depression. Electronically Signed   By: Narda Rutherford M.D.   On: 03/16/2021 01:12   CT KNEE LEFT WO CONTRAST  Result Date: 03/16/2021 CLINICAL DATA:  Left knee fracture. EXAM: CT OF THE LEFT KNEE WITHOUT CONTRAST TECHNIQUE: Multidetector CT imaging of the LEFT knee was performed according to the standard protocol. Multiplanar CT image reconstructions were also generated. COMPARISON:  03/16/2021 RADIOGRAPHS FINDINGS: Bones/Joint/Cartilage There is a split depression fracture of the lateral tibial plateau with up to 4 mm of articular surface step-off along the lateral fracture margin. Fracture line extends into the intercondylar eminence. The split fracture component is seen extending into the proximal tibiofibular joint. Additional subcortical impaction fracture of the medial tibial plateau is noted as well. Distal femur is intact. Lipohemarthrosis is noted. Ligaments Suboptimally assessed by CT. Thickening and hazy stranding about the ACL likely reflecting some degree of tearing. PCL appears intact though the tibial attachment site is closely approximated by the tibial plateau fracture line. Thickening,  stranding and laxity along the lateral supporting structures. Medial supporting structures appear intact with some minimal bowing secondary to fluid in the medial recess. Muscles and Tendons Anterior bowing of the extensor mechanism secondary to the presence of the effusion and lipohemarthrosis. Extensor mechanism otherwise remains grossly intact.  No discontinuity along the medial or lateral retinaculum. No other conspicuous musculotendinous abnormalities are seen. Soft tissues Soft tissue swelling of the knee. Effusion lipohemarthrosis as above. IMPRESSION: Split depressed fracture of the lateral tibial plateau with extension into the intercondylar eminence and fracture line extension into the proximal tibiofibular joint. Additional subcortical impaction fracture of the medial tibial plateau. Lipohemarthrosis. Thickening, laxity of the lateral supporting structures of the knee concerning for ligamentous injury. Additional stranding and irregularity of the ACL. PCL appears intact though the tibial attachment is closely approximated by the fracture line. Electronically Signed   By: Kreg Shropshire M.D.   On: 03/16/2021 02:47   DG Pelvis Portable  Result Date: 03/15/2021 CLINICAL DATA:  20 year old female with trauma. EXAM: LEFT FEMUR PORTABLE 1 VIEW; PORTABLE LEFT TIBIA AND FIBULA - 2 VIEW; PORTABLE PELVIS 1-2 VIEWS COMPARISON:  None. FINDINGS: There is a depressed fracture of the lateral tibial plateau. No other acute fracture. There is no dislocation. There is a suprapatellar lipohemarthrosis. No arthritic changes. Fetal skeleton over the lower abdomen and pelvis in cephalic position. IMPRESSION: Depressed fracture of the lateral tibial plateau. No dislocation. Electronically Signed   By: Elgie Collard M.D.   On: 03/15/2021 23:33   DG Tibia/Fibula Left Port  Result Date: 03/15/2021 CLINICAL DATA:  20 year old female with trauma. EXAM: LEFT FEMUR PORTABLE 1 VIEW; PORTABLE LEFT TIBIA AND FIBULA - 2 VIEW; PORTABLE PELVIS 1-2 VIEWS COMPARISON:  None. FINDINGS: There is a depressed fracture of the lateral tibial plateau. No other acute fracture. There is no dislocation. There is a suprapatellar lipohemarthrosis. No arthritic changes. Fetal skeleton over the lower abdomen and pelvis in cephalic position. IMPRESSION: Depressed fracture of the lateral tibial  plateau. No dislocation. Electronically Signed   By: Elgie Collard M.D.   On: 03/15/2021 23:33   DG FEMUR PORT 1V LEFT  Result Date: 03/15/2021 CLINICAL DATA:  20 year old female with trauma. EXAM: LEFT FEMUR PORTABLE 1 VIEW; PORTABLE LEFT TIBIA AND FIBULA - 2 VIEW; PORTABLE PELVIS 1-2 VIEWS COMPARISON:  None. FINDINGS: There is a depressed fracture of the lateral tibial plateau. No other acute fracture. There is no dislocation. There is a suprapatellar lipohemarthrosis. No arthritic changes. Fetal skeleton over the lower abdomen and pelvis in cephalic position. IMPRESSION: Depressed fracture of the lateral tibial plateau. No dislocation. Electronically Signed   By: Elgie Collard M.D.   On: 03/15/2021 23:33   - pertinent xrays, CT, MRI studies were reviewed and independently interpreted  Positive ROS: All other systems have been reviewed and were otherwise negative with the exception of those mentioned in the HPI and as above.  Physical Exam: General: No acute distress Cardiovascular: No pedal edema Respiratory: No cyanosis, no use of accessory musculature GI: No organomegaly, abdomen is soft and non-tender Skin: No lesions in the area of chief complaint Neurologic: Sensation intact distally Psychiatric: Patient is at baseline mood and affect Lymphatic: No axillary or cervical lymphadenopathy  MUSCULOSKELETAL:  Left knee shows a moderate joint effusion.  Small abrasion across the anterior aspect of the knee.  Ligamentous exam feels stable and symmetric to the contralateral side.  Range of motion not tested due to pain.  Assessment: Left bicondylar tibial plateau fracture  Plan: I reviewed the x-rays and CT scans with the patient which shows a significant depressed lateral tibial plateau fracture with significant articular incongruity.  Based on these findings I have recommended surgical repair for restoration of articular surface, mitigation of posttraumatic arthritis and pain relief  as well as earlier mobilization.  She has been cleared to have surgery by the [facility] team.  She will need to continue to be n.p.o.  Surgery is scheduled for today around noon.  She will need postop physical therapy evaluation for home health safety.  Anticipate out of work for at least 6 weeks.  Thank you for the consult and the opportunity to see Ms. Apolinar Junes  N. Glee Arvin, MD [facility] 8:13 AM

## 2021-03-16 NOTE — Op Note (Signed)
Date of Surgery: 03/16/2021  INDICATIONS: [name] is a 20 y.o.-year-old female with a left tibial plateau fracture indicated for osteosynthesis.  The Patient did consent to the procedure after discussion of the risks and benefits.  PREOPERATIVE DIAGNOSIS: Depressed lateral tibial plateau fracture, nondisplaced medial tibial plateau fracture  POSTOPERATIVE DIAGNOSIS: Same.  PROCEDURE: Open reduction internal fixation of left bicondylar tibial plateau fracture  SURGEON: N. Eduard Roux, M.D.  ASSIST: Ciro Backer Thompson, [place]; necessary for the timely completion of procedure and due to complexity of procedure.  ANESTHESIA:  spinal, MAC  IV FLUIDS AND URINE: See anesthesia.  ESTIMATED BLOOD LOSS: minimal mL.  IMPLANTS: Smith and [employer]hew proximal tibial locking plate  DRAINS: none  COMPLICATIONS: see description of procedure.  DESCRIPTION OF PROCEDURE: The patient was brought to the operating room.  The patient had been signed prior to the procedure and this was documented. The patient had the anesthesia placed by the anesthesiologist.  A time-out was performed to confirm that this was the correct patient, site, side and location. The patient did receive antibiotics prior to the incision and was re-dosed during the procedure as needed at indicated intervals.  A tourniquet was placed.  The patient had the operative extremity prepped and draped in the standard surgical fashion.    An incision was made on the lateral aspect of the knee.  Dissection was carried down onto the IT band which was then split in line with its fibers and sharply elevated off of the proximal tibia and Gertie's tubercle.  The anterior muscular compartment was elevated off of the tibia.  The pericapsular fat was encountered.  Some meniscal arthrotomy was created and there was a large hemarthrosis which was evacuated.  The meniscus itself was intact and without any injury.  The large central depressed fracture  fragment was directly visualized.  There was a vertical split through the lateral tibial plateau through which I was able to book open and accessed the subchondral and cancellous bone underneath the depressed fragment.  I used 1/4 inch osteotome in order to lever up the depressed fragment back up to its anatomic position.  I was able to directly visualize the reduction which was anatomic.  I then injected about 1.5 cc of demineralized bone matrix to fill the void.  She had excellent cancellous bone quality.  The split fragment was then closed back to its original position like a trapdoor and the condyles were then compressed and the reduction was pinned with K wires.  This was confirmed under orthogonal views on fluoroscopy.  I then placed a proximal tibial locking plate at the appropriate position.  The proximal holes were filled first with a partially-threaded cancellous screw to compress the plate and the fracture together.  I then placed 2 locking screws parallel to the joint using fluoroscopic guidance.  I was happy with the reduction.  I then placed a few more nonlocking screws through the distal holes of the plate.  Each screw had excellent fixation.  The screws that I placed across were able to also provide fixation to the nondisplaced medial tibial plateau impaction fracture.  Final x-rays were taken.  The some meniscal arthrotomy was repaired with 0 Vicryl.  This was thoroughly irrigated.  Vancomycin powder was placed.  Local anesthetic was placed.  Layered closure performed.  Sterile dressings were applied.  Patient tolerated procedure well had no me complications.  [name] was necessary for opening, closing, retracting, limb positioning and overall facilitation and timely  completion of the procedure.  POSTOPERATIVE PLAN: Patient will be nonweightbearing to the left lower extremity.  She may initiate range of motion of the knee as tolerated.  She should wear the Bledsoe brace when out of bed  and ambulating but does not need to wear it otherwise.  She will need to start Lovenox for DVT prophylaxis in the morning.  [name], MD 2:08 PM

## 2021-03-16 NOTE — Progress Notes (Signed)
Patient is s/p ORIF left tibial plateau fracture today.  Plan is to mobilize with PT in the morning.  NWB LLE.  Unlocked bledsoe brace has been ordered for her to use when OOB.  ROM of left knee as tolerated.  Lovenox has been ordered to start tomorrow at 10 am.  From an ortho stand point, patient may d/c home once she clears PT.  Follow up in office in 2 weeks.

## 2021-03-16 NOTE — Progress Notes (Signed)
Dr Donavan Foil notified that Post-Op NST is reactive and reassuring.  OBSC nurse made aware that she will need to monitor FHR continuously for 4hrs when pt returns to her room.

## 2021-03-16 NOTE — Progress Notes (Signed)
Orthopedic Tech Progress Note Patient Details:  [name] Apr 07, 2001 [id number] ROM knee brace has been ordered from [employer].  Patient ID: Alexandra Cole, female   DOB: 03-13-2001, 20 y.o.   MRN: [id number]  [name] 03/16/2021, 4:43 PM

## 2021-03-16 NOTE — Progress Notes (Signed)
ANTICOAGULATION CONSULT NOTE - Initial Consult  Pharmacy Consult for enoxaparin dosing Indication: VTE prophylaxis  Not on File  Patient Measurements: Height: 5\' 2"  (157.5 cm) Weight: 55.8 kg (123 lb) IBW/kg (Calculated) : 50.1  Vital Signs: Temp: 98.7 F (37.1 C) (07/29 1515) Temp Source: Oral (07/29 1102) BP: 114/58 (07/29 1515) Pulse Rate: 63 (07/29 1515)  Labs: Recent Labs    03/16/21 0310  HGB 10.3*  HCT 30.1*  PLT 172    CrCl cannot be calculated (No successful lab value found.).   Medical History: History reviewed. No pertinent past medical history.  Assessment: 20 y.o. female G2P1 [redacted]w[redacted]d presenting after fall from standing resulting in left tibial fracture. Now s/p OR for surgical repair. CBC wnl with Hgb 10.3, HCT 30.1, Plt 172. Pharmacy has been consulted to dose enoxaparin for DVT prophylaxis.   Goal of Therapy:  Anti-Xa level 0.6-1 units/ml 4hrs after LMWH dose given Monitor platelets by anticoagulation protocol: Yes   Plan:  Start enoxaparin 40mg  q24h on 7/30 @1000  - Monitor renal function and s/sx of bleeding  , PharmD PGY2 Pediatric Pharmacy Resident 03/16/2021 3:27 PM  Please check [email or web address] for unit-specific pharmacy phone numbers.

## 2021-03-16 NOTE — Anesthesia Preprocedure Evaluation (Addendum)
Anesthesia Evaluation  Patient identified by MRN, date of birth, ID band Patient awake    Reviewed: Allergy & Precautions, NPO status , Patient's Chart, lab work & pertinent test results  Airway Mallampati: II  TM Distance: >3 FB Neck ROM: Full    Dental no notable dental hx.    Pulmonary neg pulmonary ROS,    Pulmonary exam normal breath sounds clear to auscultation       Cardiovascular negative cardio ROS Normal cardiovascular exam Rhythm:Regular Rate:Normal     Neuro/Psych negative neurological ROS  negative psych ROS   GI/Hepatic negative GI ROS, Neg liver ROS,   Endo/Other  negative endocrine ROS  Renal/GU negative Renal ROS  negative genitourinary   Musculoskeletal negative musculoskeletal ROS (+)   Abdominal   Peds negative pediatric ROS (+)  Hematology negative hematology ROS (+)   Anesthesia Other Findings   Reproductive/Obstetrics (+) Pregnancy                            Anesthesia Physical Anesthesia Plan  ASA: 2  Anesthesia Plan: Spinal   Post-op Pain Management:    Induction: Intravenous  PONV Risk Score and Plan: 2 and Treatment may vary due to age or medical condition and Propofol infusion  Airway Management Planned: Natural Airway and Simple Face Mask  Additional Equipment: None  Intra-op Plan:   Post-operative Plan: Extubation in OR  Informed Consent: I have reviewed the patients History and Physical, chart, labs and discussed the procedure including the risks, benefits and alternatives for the proposed anesthesia with the patient or authorized representative who has indicated his/her understanding and acceptance.     Dental advisory given  Plan Discussed with: CRNA and Anesthesiologist  Anesthesia Plan Comments: (Patient admitted on OB floor and they have cleared her for surgery today. Will plan for spinal + propofol gtt. GETA as backup plan. Tanna Furry, MD  )      Anesthesia Quick Evaluation

## 2021-03-16 NOTE — Anesthesia Procedure Notes (Signed)
Procedure Name: MAC Date/Time: 03/16/2021 11:54 AM Performed by: Claris Che, CRNA Pre-anesthesia Checklist: Patient identified, Emergency Drugs available, Suction available, Patient being monitored and Timeout performed Patient Re-evaluated:Patient Re-evaluated prior to induction Oxygen Delivery Method: Simple face mask Dental Injury: Teeth and Oropharynx as per pre-operative assessment

## 2021-03-16 NOTE — ED Notes (Signed)
Ortho tech made aware that pt requires knee immobilizer.

## 2021-03-16 NOTE — Transfer of Care (Signed)
Immediate Anesthesia Transfer of Care Note  Patient: Alexandra Cole  Procedure(s) Performed: OPEN REDUCTION INTERNAL FIXATION (ORIF) TIBIAL PLATEAU (Left)  Patient Location: PACU  Anesthesia Type:General  Level of Consciousness: awake, alert  and patient cooperative  Airway & Oxygen Therapy: Patient Spontanous Breathing  Post-op Assessment: Report given to RN and Post -op Vital signs reviewed and unstable, Anesthesiologist notified  Post vital signs: Reviewed and stable  Last Vitals:  Vitals Value Taken Time  BP 208/190 03/16/21 1343  Temp    Pulse 73 03/16/21 1344  Resp 14 03/16/21 1344  SpO2 100 % 03/16/21 1344  Vitals shown include unvalidated device data.  Last Pain:  Vitals:   03/16/21 1110  TempSrc:   PainSc: 0-No pain         Complications: No notable events documented.

## 2021-03-16 NOTE — ED Provider Notes (Signed)
104 case d/w Dr. Roda Shutters.  Dr. Roda Shutters has ordered CT knee to look at fracture.  IF admitted he will consult for surgery.  If discharged Dr. Roda Shutters will follow up with the patient for surgery.     Olivia Royse, MD 03/16/21 9628

## 2021-03-17 DIAGNOSIS — Z9889 Other specified postprocedural states: Secondary | ICD-10-CM

## 2021-03-17 DIAGNOSIS — Z09 Encounter for follow-up examination after completed treatment for conditions other than malignant neoplasm: Secondary | ICD-10-CM

## 2021-03-17 DIAGNOSIS — Z3480 Encounter for supervision of other normal pregnancy, unspecified trimester: Secondary | ICD-10-CM

## 2021-03-17 DIAGNOSIS — Z3493 Encounter for supervision of normal pregnancy, unspecified, third trimester: Secondary | ICD-10-CM

## 2021-03-17 DIAGNOSIS — S82209A Unspecified fracture of shaft of unspecified tibia, initial encounter for closed fracture: Secondary | ICD-10-CM | POA: Diagnosis present

## 2021-03-17 MED ORDER — HYDROCODONE-ACETAMINOPHEN 5-325 MG PO TABS: 1.0000 | ORAL_TABLET | Freq: Four times a day (QID) | ORAL | 0 refills | Status: DC | PRN

## 2021-03-17 MED ORDER — ENOXAPARIN (LOVENOX) PATIENT EDUCATION KIT
PACK | Freq: Once | Status: DC
  Filled 2021-03-17: qty 1

## 2021-03-17 MED ORDER — ENOXAPARIN SODIUM 40 MG/0.4ML IJ SOSY: 40.0000 mg | PREFILLED_SYRINGE | INTRAMUSCULAR | 0 refills | Status: AC

## 2021-03-17 NOTE — Progress Notes (Signed)
Patient sent home with 2 wheel walker and bedside commode.

## 2021-03-17 NOTE — Progress Notes (Signed)
Daily Antepartum Note  Admission Date: 03/15/2021 Current Date: 03/17/2021 8:42 AM  Alexandra Cole is a 20 y.o. G2P0 @ [redacted]w[redacted]d POD#1 s/p ORIF left tibial plateau fracture, admitted for fall and fx.  Pregnancy complicated by: Patient Active Problem List   Diagnosis Date Noted   Tibia fracture 03/17/2021   Status post fall 03/16/2021   Closed fracture of tibial plateau    Supervision of normal pregnancy 09/28/2020   History of pre-eclampsia 11/29/2019    Overnight/24hr events:  none  Subjective:  No OB issues. Pain stable  Objective:     Current Vital Signs 24h Vital Sign Ranges  T 97.8 F (36.6 C) Temp  Avg: 98.1 F (36.7 C)  Min: 97.2 F (36.2 C)  Max: 98.8 F (37.1 C)  BP 112/72 BP  Min: 62/54  Max: 199/176  HR (!) 114  Pulse  Avg: 75.3  Min: 59  Max: 116  RR 17 Resp  Avg: 15.1  Min: 11  Max: 18  SaO2 99 %  (room air) SpO2  Avg: 97.6 %  Min: 95 %  Max: 100 %       24 Hour I/O Current Shift I/O  Time Ins Outs 07/29 0701 - 07/30 0700 In: 1513 [P.O.:240; I.V.:1253] Out: 1300 [Urine:1300] No intake/output data recorded.   Fetal Heart Tones: 130 baseline, +accels, one slight variable decel, mod variability Tocometry: occasional contractions  Physical exam: General: Well nourished, well developed female in no acute distress. Abdomen: gravid nttp Respiratory: no resp distress Extremities: no clubbing, cyanosis or edema Skin: Warm and dry.   Medications: Current Facility-Administered Medications  Medication Dose Route Frequency Provider Last Rate Last Admin   0.9 %  sodium chloride infusion  250 mL Intravenous PRN Tarry Kos, MD       acetaminophen (TYLENOL) tablet 650 mg  650 mg Oral Q4H PRN Tarry Kos, MD   650 mg at 03/16/21 0740   aspirin chewable tablet 81 mg  81 mg Oral Daily Tarry Kos, MD       calcium carbonate (TUMS - dosed in mg elemental calcium) chewable tablet 400 mg of elemental calcium  2 tablet Oral Q4H PRN Tarry Kos, MD        docusate sodium (COLACE) capsule 100 mg  100 mg Oral Daily Tarry Kos, MD       enoxaparin (LOVENOX) injection 40 mg  40 mg Subcutaneous Q24H Desiree Hane, RPH       HYDROmorphone (DILAUDID) injection 0.5 mg  0.5 mg Intravenous Q2H PRN Tarry Kos, MD   0.5 mg at 03/17/21 6834   oxyCODONE (Oxy IR/ROXICODONE) immediate release tablet 5 mg  5 mg Oral Q4H PRN Tarry Kos, MD   5 mg at 03/17/21 0542   prenatal multivitamin tablet 1 tablet  1 tablet Oral Q1200 Tarry Kos, MD       sodium chloride flush (NS) 0.9 % injection 3 mL  3 mL Intravenous Q12H Tarry Kos, MD   3 mL at 03/16/21 2153   sodium chloride flush (NS) 0.9 % injection 3 mL  3 mL Intravenous PRN Tarry Kos, MD       zolpidem (AMBIEN) tablet 5 mg  5 mg Oral QHS PRN Tarry Kos, MD        Labs:  Recent Labs  Lab 03/16/21 0310  WBC 10.5  HGB 10.3*  HCT 30.1*  PLT 172   Radiology:  No new imaging  Assessment &  Plan:  Pt doing well *Pregnancy:reactive NST; qday NSTs. No OB issues *Ortho: f/u PT consult. Touch base with ortho to see about need to go home on ppx lovenox *PPx: lovenox to start this morning *Dispo: per PT dispo  Cornelia Copa MD Attending Center for [facility] (Faculty Practice) GYN Consult Phone: [phone] (M-F, [phone]) & [phone]  (Off hours, weekends, holidays)

## 2021-03-17 NOTE — Progress Notes (Signed)
Instruction and demonstration given to patient with return demonstration.  Patient states understanding.

## 2021-03-17 NOTE — Evaluation (Signed)
Physical Therapy Evaluation Patient Details Name: Alexandra Cole MRN: [id number] DOB: [birth year] Today's Date: 03/17/2021   History of Present Illness  Pt ia a 20 y.o. female G2P0 @ [redacted]w[redacted]d admitted following a fall at home in which she sustained L tibial plateau fx. She underwent ORIF 7/29.   Clinical Impression  Pt admitted with above diagnosis. PTA pt lived at home with her husband and 1 y.o. daughter. Pt independent mobility, working as a Scientist, physiological, and currently [redacted] weeks pregnant. On eval, she required min assist bed mobility, min assist transfers, and min guard assist ambulation 30' with RW. Educated pt and husband in room on ascend/descend stairs with RW vs 2 rails. Pt/husband verbalize understanding. Pt demonstrates good ability to maintain LLE NWB. L knee immobilizer used during session, due to bledsoe brace not yet available. Husband reports it is to be delivered this morning. Pt instructed on use of ice 20 minutes/hour for first 48 hours, and PRN for pain after. Pt currently with functional limitations due to the deficits listed below (see PT Problem List). Pt will benefit from skilled PT to increase their independence and safety with mobility to allow discharge home. Pt reports her sister will be able to stay with her while her husband is at work. PT to follow acutely. No follow up services indicated.       Follow Up Recommendations No PT follow up    Equipment Recommendations  Rolling walker with 5" wheels;3in1 (PT)    Recommendations for Other Services       Precautions / Restrictions Precautions Precautions: Fall;Other (comment) Precaution Comments: [redacted] weeks pregnant Required Braces or Orthoses: Other Brace Other Brace: unlocked bledsoe brace LLE when OOB Restrictions Weight Bearing Restrictions: Yes LLE Weight Bearing: Non weight bearing      Mobility  Bed Mobility Overal bed mobility: Needs Assistance Bed Mobility: Supine to Sit;Sit to Supine      Supine to sit: Min assist;HOB elevated Sit to supine: Min assist;HOB elevated   General bed mobility comments: increased time, cues for sequencing, assist for LLE    Transfers Overall transfer level: Needs assistance Equipment used: Rolling walker (2 wheeled) Transfers: Sit to/from Stand Sit to Stand: Min assist         General transfer comment: cues for hand placement, assist to power up  Ambulation/Gait Ambulation/Gait assistance: Min guard Gait Distance (Feet): 30 Feet Assistive device: Rolling walker (2 wheeled) Gait Pattern/deviations: Step-to pattern;Antalgic Gait velocity: decreased Gait velocity interpretation: <1.31 ft/sec, indicative of household ambulator General Gait Details: cues for sequencing, good ability to maintain NWB LLE, mobilized in L knee immobilizer due to bledsoe brace not yet delivered  Stairs Stairs: Yes       General stair comments: verbally educated and demonstrated in room ascend/descend stairs with RW and stairs with 2 rails. Pt and husband verbalize understanding.  Wheelchair Mobility    Modified Rankin (Stroke Patients Only)       Balance Overall balance assessment: Needs assistance Sitting-balance support: No upper extremity supported;Feet supported Sitting balance-Leahy Scale: Good     Standing balance support: Bilateral upper extremity supported;During functional activity Standing balance-Leahy Scale: Poor Standing balance comment: reliant on BUE support due to LLE NWB                             Pertinent Vitals/Pain Pain Assessment: 0-10 Pain Score: 8  Pain Location: LLE Pain Descriptors / Indicators: Pressure;Grimacing;Guarding;Discomfort Pain Intervention(s): Repositioned;Monitored during session;Premedicated before session;Ice  applied    Home Living Family/patient expects to be discharged to:: Private residence Living Arrangements: Spouse/significant other;Children Available Help at Discharge:  Family;Available 24 hours/day Type of Home: House Home Access: Stairs to enter Entrance Stairs-Rails: Right;Left;Can reach both Entrance Stairs-Number of Steps: 2 Home Layout: One level Home Equipment: Walker - 2 wheels;Bedside commode      Prior Function Level of Independence: Independent         Comments: works as a Social worker        Extremity/Trunk Assessment   Upper Extremity Assessment Upper Extremity Assessment: Overall WFL for tasks assessed    Lower Extremity Assessment Lower Extremity Assessment: LLE deficits/detail LLE Deficits / Details: limited ROM at knee, approx 0-20 degrees AROM LLE: Unable to fully assess due to pain    Cervical / Trunk Assessment Cervical / Trunk Assessment: Normal  Communication   Communication: No difficulties  Cognition Arousal/Alertness: Awake/alert Behavior During Therapy: WFL for tasks assessed/performed Overall Cognitive Status: Within Functional Limits for tasks assessed                                        General Comments      Exercises General Exercises - Lower Extremity Ankle Circles/Pumps: AROM;Both;10 reps;Supine   Assessment/Plan    PT Assessment Patient needs continued PT services  PT Problem List Decreased mobility;Decreased knowledge of precautions;Decreased activity tolerance;Decreased balance;Pain;Decreased knowledge of use of DME       PT Treatment Interventions DME instruction;Therapeutic activities;Gait training;Therapeutic exercise;Patient/family education;Stair training;Balance training;Functional mobility training    PT Goals (Current goals can be found in the Care Plan section)  Acute Rehab PT Goals Patient Stated Goal: home PT Goal Formulation: With patient/family Time For Goal Achievement: 03/31/21 Potential to Achieve Goals: Good    Frequency Min 6X/week   Barriers to discharge        Co-evaluation               AM-PAC PT "6 Clicks"  Mobility  Outcome Measure Help needed turning from your back to your side while in a flat bed without using bedrails?: A Little Help needed moving from lying on your back to sitting on the side of a flat bed without using bedrails?: A Little Help needed moving to and from a bed to a chair (including a wheelchair)?: A Little Help needed standing up from a chair using your arms (e.g., wheelchair or bedside chair)?: A Little Help needed to walk in hospital room?: A Little Help needed climbing 3-5 steps with a railing? : A Lot 6 Click Score: 17    End of Session Equipment Utilized During Treatment: Gait belt;Left knee immobilizer Activity Tolerance: Patient tolerated treatment well Patient left: in bed;with call bell/phone within reach;with family/visitor present Nurse Communication: Mobility status;Weight bearing status PT Visit Diagnosis: Pain;Difficulty in walking, not elsewhere classified (R26.2) Pain - Right/Left: Left Pain - part of body: Leg    Time: 9323-5573 PT Time Calculation (min) (ACUTE ONLY): 43 min   Charges:   PT Evaluation $PT Eval Moderate Complexity: 1 Mod PT Treatments $Gait Training: 23-37 mins        Aida Raider, PT  Office # [phone] Pager [phone]   Ilda Foil 03/17/2021, 9:34 AM

## 2021-03-17 NOTE — Progress Notes (Signed)
Previous note in reference to Lovenox injection.

## 2021-03-17 NOTE — Progress Notes (Signed)
Left foot capillary refill brisk, warm to touch.  Immobilizer in place.  1+ edema noted on left foot and knee.

## 2021-03-17 NOTE — TOC Progression Note (Signed)
Transition of Care [facility]) - Progression Note    Patient Details  Name: Alexandra Cole MRN: [id number] Date of Birth: 2000-12-29  Transition of Care [facility]) CM/SW Contact  Bess Kinds, RN Phone Number: [phone] 03/17/2021, 12:36 PM  Clinical Narrative:     Received call from MD that patient was in need of DME per PT recommendations. DME orders placed for 3N1 and RW. Referral to [employer] for delivery to the room.      Expected Discharge Plan and Services                                                 Social Determinants of Health (SDOH) Interventions    Readmission Risk Interventions No flowsheet data found.

## 2021-03-20 ENCOUNTER — Encounter (HOSPITAL_COMMUNITY): Payer: Self-pay | Admitting: Orthopaedic Surgery

## 2021-03-20 NOTE — Anesthesia Postprocedure Evaluation (Signed)
Anesthesia Post Note  Patient: Loan Oguin  Procedure(s) Performed: OPEN REDUCTION INTERNAL FIXATION (ORIF) TIBIAL PLATEAU (Left)     Patient location during evaluation: PACU Anesthesia Type: Spinal and MAC Level of consciousness: awake and alert Pain management: pain level controlled Vital Signs Assessment: post-procedure vital signs reviewed and stable Respiratory status: spontaneous breathing, nonlabored ventilation, respiratory function stable and patient connected to nasal cannula oxygen Cardiovascular status: stable and blood pressure returned to baseline Postop Assessment: no apparent nausea or vomiting Anesthetic complications: no   No notable events documented.  Last Vitals:  Vitals:   03/17/21 0747 03/17/21 1230  BP: 112/72 126/69  Pulse: (!) 114 (!) 117  Resp: 17 18  Temp: 36.6 C 36.8 C  SpO2: 99% 98%    Last Pain:  Vitals:   03/17/21 1440  TempSrc:   PainSc: 4                  Esiah Bazinet

## 2021-03-22 ENCOUNTER — Encounter (HOSPITAL_COMMUNITY): Payer: Self-pay | Admitting: Orthopaedic Surgery

## 2021-03-30 ENCOUNTER — Ambulatory Visit (INDEPENDENT_AMBULATORY_CARE_PROVIDER_SITE_OTHER): Payer: Medicaid Other | Admitting: Orthopaedic Surgery

## 2021-03-30 ENCOUNTER — Other Ambulatory Visit: Payer: Self-pay

## 2021-03-30 ENCOUNTER — Encounter: Payer: Self-pay | Admitting: Orthopaedic Surgery

## 2021-03-30 ENCOUNTER — Ambulatory Visit (INDEPENDENT_AMBULATORY_CARE_PROVIDER_SITE_OTHER): Payer: Medicaid Other

## 2021-03-30 DIAGNOSIS — M25562 Pain in left knee: Secondary | ICD-10-CM | POA: Diagnosis not present

## 2021-03-30 DIAGNOSIS — G8929 Other chronic pain: Secondary | ICD-10-CM

## 2021-03-31 NOTE — Progress Notes (Signed)
   Post-Op Visit Note   Patient: Alexandra Cole           Date of Birth: [birth year]           MRN: [id number] Visit Date: 03/30/2021 PCP: Pcp, No   Assessment & Plan:  Chief Complaint:  Chief Complaint  Patient presents with   Left Knee - Routine Post Op, Pain   Visit Diagnoses:  1. Chronic pain of left knee     Plan: Patient is 2 weeks status post ORIF left lateral tibial plateau fracture.  She has now been switched to subcu heparin she is now about [redacted]weeks pregnant.  Currently not taking any pain medications as the pain is improving.  Left knee shows a healed surgical incision.  Mild swelling.  Gentle range of motion of the knee is well-tolerated.  X-rays show stable fixation alignment of of a depressed lateral tibial plateau fracture.  Sutures removed Steri-Strips applied today.  She should continue to wear the unlocked Bledsoe brace during ambulation.  She needs to continue nonweightbearing.  For her upcoming planned vaginal delivery I feel that she should wear the Bledsoe brace while she is in the stirrups.  She may flex her knee as much as she can tolerate.  Recheck in 2 weeks with two-view x-rays of the left knee.  Follow-Up Instructions: Return in about 2 weeks (around 04/13/2021).   Orders:  Orders Placed This Encounter  Procedures   XR Knee 1-2 Views Left   No orders of the defined types were placed in this encounter.   Imaging: No results found.  PMFS History: Patient Active Problem List   Diagnosis Date Noted   Tibia fracture 03/17/2021   Status post fall 03/16/2021   Closed fracture of tibial plateau    Supervision of normal pregnancy 09/28/2020   History of pre-eclampsia 11/29/2019   History reviewed. No pertinent past medical history.  History reviewed. No pertinent family history.  Past Surgical History:  Procedure Laterality Date   ORIF TIBIA PLATEAU Left 03/16/2021   Procedure: OPEN REDUCTION INTERNAL FIXATION (ORIF) TIBIAL PLATEAU;   Surgeon: Tarry Kos, MD;  Location: [facility] OR;  Service: Orthopedics;  Laterality: Left;   Social History   Occupational History   Not on file  Tobacco Use   Smoking status: Never   Smokeless tobacco: Never  Vaping Use   Vaping Use: Never used  Substance and Sexual Activity   Alcohol use: Not Currently   Drug use: Not Currently   Sexual activity: Not on file

## 2021-04-05 ENCOUNTER — Telehealth: Payer: Self-pay

## 2021-04-05 NOTE — Telephone Encounter (Signed)
Patient had a Referral for HHPT for Gait and Knee ROM. 3 agencies denied this due to her ins cov. What would you like to do? She is coming back to see [place] on 04/13/2021.

## 2021-04-09 NOTE — Telephone Encounter (Signed)
Can she be referred to outpatient PT for 1 visit to learn ROM exercises?  Thanks.

## 2021-04-10 ENCOUNTER — Other Ambulatory Visit: Payer: Self-pay

## 2021-04-10 DIAGNOSIS — G8929 Other chronic pain: Secondary | ICD-10-CM

## 2021-04-10 NOTE — Telephone Encounter (Signed)
Referral made 

## 2021-04-13 ENCOUNTER — Other Ambulatory Visit: Payer: Self-pay

## 2021-04-13 ENCOUNTER — Ambulatory Visit (INDEPENDENT_AMBULATORY_CARE_PROVIDER_SITE_OTHER): Payer: Medicaid Other | Admitting: Orthopaedic Surgery

## 2021-04-13 ENCOUNTER — Ambulatory Visit (INDEPENDENT_AMBULATORY_CARE_PROVIDER_SITE_OTHER): Payer: Medicaid Other

## 2021-04-13 ENCOUNTER — Encounter: Payer: Self-pay | Admitting: Orthopaedic Surgery

## 2021-04-13 DIAGNOSIS — G8929 Other chronic pain: Secondary | ICD-10-CM

## 2021-04-13 DIAGNOSIS — S82142A Displaced bicondylar fracture of left tibia, initial encounter for closed fracture: Secondary | ICD-10-CM

## 2021-04-13 DIAGNOSIS — M25562 Pain in left knee: Secondary | ICD-10-CM | POA: Diagnosis not present

## 2021-04-13 MED ORDER — HYDROCODONE-ACETAMINOPHEN 5-325 MG PO TABS: 1.0000 | ORAL_TABLET | Freq: Three times a day (TID) | ORAL | 0 refills | Status: AC | PRN

## 2021-04-14 NOTE — Progress Notes (Signed)
   Post-Op Visit Note   Patient: Alexandra Cole           Date of Birth: February 21, 2001           MRN: [id number] Visit Date: 04/13/2021 PCP: Pcp, No   Assessment & Plan:  Chief Complaint:  Chief Complaint  Patient presents with   Left Knee - Pain   Visit Diagnoses:  1. Closed fracture of left tibial plateau, initial encounter   2. Chronic pain of left knee     Plan: Patient is 4 weeks status post ORIF of the left lateral tibial plateau fracture.  She is doing better overall.  Left knee surgical scar is healed.  Passive range of motion 10 to 90 degrees with mild discomfort.  Collaterals and cruciates are stable.  We provided her with a better fitting hinged knee brace today so that this will actually give her some support.  Her x-rays demonstrate stable fixation and the fracture is healing therefore at this point we will go and advance her to weight-bear as tolerated.  We made a referral to outpatient physical therapy for gait training range of motion and strengthening.  Follow-up in 4 weeks with two-view x-rays of the left knee.  Follow-Up Instructions: Return in about 4 weeks (around 05/11/2021).   Orders:  Orders Placed This Encounter  Procedures   XR Knee 1-2 Views Left   Ambulatory referral to Physical Therapy   Meds ordered this encounter  Medications   HYDROcodone-acetaminophen (NORCO) 5-325 MG tablet    Sig: Take 1 tablet by mouth 3 (three) times daily as needed.    Dispense:  30 tablet    Refill:  0    Imaging: No results found.  PMFS History: Patient Active Problem List   Diagnosis Date Noted   Tibia fracture 03/17/2021   Status post fall 03/16/2021   Closed fracture of tibial plateau    Supervision of normal pregnancy 09/28/2020   History of pre-eclampsia 11/29/2019   History reviewed. No pertinent past medical history.  History reviewed. No pertinent family history.  Past Surgical History:  Procedure Laterality Date   ORIF TIBIA PLATEAU Left  03/16/2021   Procedure: OPEN REDUCTION INTERNAL FIXATION (ORIF) TIBIAL PLATEAU;  Surgeon: Tarry Kos, MD;  Location: [facility] OR;  Service: Orthopedics;  Laterality: Left;   Social History   Occupational History   Not on file  Tobacco Use   Smoking status: Never   Smokeless tobacco: Never  Vaping Use   Vaping Use: Never used  Substance and Sexual Activity   Alcohol use: Not Currently   Drug use: Not Currently   Sexual activity: Not on file

## 2021-04-16 NOTE — Discharge Summary (Signed)
Physician Discharge Summary  Patient ID: Alexandra Cole MRN: [id number] DOB/AGE: [birth year] 20 y.o.  Admit date: 03/15/2021 Discharge date: 04/16/2021  Admission Diagnoses: Pregnancy at 34/2 weeks. S/p fall. Tibial plateau fracture. Rh positive  Discharge Diagnoses: Pregnancy at 34/3 weeks. S/p Open reduction internal fixation of left bicondylar tibial plateau fracture by Orthopedics   Discharged Condition: good  Hospital Course: Patient admitted for ortho surgery and patient discharged to home on POD#1. Reassuring maternal fetal status during hospitalization  Consults:  Ortho  , PT  Discharge Exam: Blood pressure 126/69, pulse (!) 117, temperature 98.3 F (36.8 C), temperature source Oral, resp. rate 18, height 5\' 2"  (1.575 m), weight 55.8 kg, SpO2 98 %. NAD Abdomen: gravid, nttp  Disposition: Discharge disposition: 01-Home or Self Care        Allergies as of 03/17/2021   Not on File      Medication List     TAKE these medications    aspirin 81 MG chewable tablet Chew 81 mg by mouth daily.   enoxaparin 40 MG/0.4ML injection Commonly known as: LOVENOX Inject 0.4 mLs (40 mg total) into the skin daily for 30 doses.   ondansetron 4 MG tablet Commonly known as: ZOFRAN Take 4 mg by mouth every 8 (eight) hours as needed.        Follow-up Information     03/19/2021, MD .   Specialty: Orthopedic Surgery Contact information: [address] [place] [phone]                 Signed: [phone] 03/17/2021

## 2021-04-19 ENCOUNTER — Ambulatory Visit: Payer: Medicaid Other

## 2021-05-02 ENCOUNTER — Ambulatory Visit: Payer: Medicaid Other | Attending: Orthopaedic Surgery

## 2021-05-02 ENCOUNTER — Other Ambulatory Visit: Payer: Self-pay

## 2021-05-02 DIAGNOSIS — M25662 Stiffness of left knee, not elsewhere classified: Secondary | ICD-10-CM | POA: Diagnosis present

## 2021-05-02 DIAGNOSIS — M25562 Pain in left knee: Secondary | ICD-10-CM | POA: Diagnosis present

## 2021-05-02 DIAGNOSIS — M6281 Muscle weakness (generalized): Secondary | ICD-10-CM | POA: Diagnosis present

## 2021-05-02 DIAGNOSIS — R262 Difficulty in walking, not elsewhere classified: Secondary | ICD-10-CM | POA: Diagnosis present

## 2021-05-02 NOTE — Therapy (Signed)
[facility] Outpatient Rehabilitation [facility] [address] [place], [place], [address] Phone: [phone]   Fax:  [phone]  Physical Therapy Evaluation  Patient Details  Name: Alexandra Cole MRN: [id number] Date of Birth: Aug 22, 2000 Referring Provider (PT): Tarry Kos, MD   Encounter Date: 05/02/2021   PT End of Session - 05/02/21 1443     Visit Number 1    Number of Visits 17    Date for PT Re-Evaluation 06/30/21    Authorization Type [employer]- requesting auth    PT Start Time 1445    PT Stop Time 1516    PT Time Calculation (min) 31 min    Equipment Utilized During Treatment Other (comment)   hinged knee brace   Activity Tolerance Patient tolerated treatment well    Behavior During Therapy [facility] for tasks assessed/performed             History reviewed. No pertinent past medical history.  Past Surgical History:  Procedure Laterality Date   ORIF TIBIA PLATEAU Left 03/16/2021   Procedure: OPEN REDUCTION INTERNAL FIXATION (ORIF) TIBIAL PLATEAU;  Surgeon: Tarry Kos, MD;  Location: [facility] OR;  Service: Orthopedics;  Laterality: Left;    There were no vitals filed for this visit.    Subjective Assessment - 05/02/21 1445     Subjective Patient is s/p ORIF Lt lateral tibial plateau fracture on 03/16/21. She has been able to walk without her walker for about a week reporting the only time it bothers her is if she is taking bigger steps or walking a long time. She is wearing a hinged brace with all activity currently. She has limited bending/squatting activity because she is unsure if she has the strength for this yet. No pain currently, but pain at worst 3/10 with prolonged walking.    Pertinent History recent childbirth on 04/17/21    Limitations Lifting;Standing;Walking;House hold activities    How long can you sit comfortably? sitting is fine    How long can you stand comfortably? hasn't bothered her with standing    How long can you walk  comfortably? maybe 15-20 minutes, with small steps    Patient Stated Goals to get away from the brace    Currently in Pain? No/denies                [facility] PT Assessment - 05/02/21 0001       Assessment   Medical Diagnosis M25.562,G89.29 (ICD-10-CM) - Chronic pain of left knee  S82.142A (ICD-10-CM) - Closed fracture of left tibial plateau, initial encounter    Referring Provider (PT) Tarry Kos, MD    Onset Date/Surgical Date 03/16/21    Hand Dominance Right    Next MD Visit 05/11/21    Prior Therapy no      Precautions   Precautions None      Restrictions   Weight Bearing Restrictions Yes    LLE Weight Bearing Weight bearing as tolerated      Balance Screen   Has the patient fallen in the past 6 months Yes    How many times? 1   tripped in her shoes while carrying her daughter in the house   Has the patient had a decrease in activity level because of a fear of falling?  No    Is the patient reluctant to leave their home because of a fear of falling?  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    Leisure spend time with her kids      Cognition   Overall Cognitive Status Within Functional Limits for tasks assessed      Observation/Other Assessments   Skin Integrity unable to assess due to clothing    Focus on Therapeutic Outcomes (FOTO)  n/a mcd      Sensation   Light Touch Not tested      Coordination   Gross Motor Movements are Fluid and Coordinated Yes      Functional Tests   Functional tests Squat      Squat   Comments valgus collapse, limited depth      Posture/Postural Control   Posture/Postural Control No significant limitations       AROM   Right Knee Extension --   5 hyper   Right Knee Flexion 140    Left Knee Extension --   lacking 8   Left Knee Flexion 110      Strength   Right Hip Flexion 4/5    Left Hip Flexion 4-/5   pain   Right Knee Flexion 5/5    Right Knee Extension 5/5    Left Knee Flexion 4-/5    Left Knee Extension 3-/5      Palpation   Palpation comment diffuse tenderness about proximal tibia      Transfers   Five time sit to stand comments  14.3 seconds      Ambulation/Gait   Ambulation/Gait Yes    Gait Comments limited knee extension, decreased step length bilaterally, limited push off LLE                        Objective measurements completed on examination: See above findings.       [facility] Adult PT Treatment/Exercise - 05/02/21 0001       Self-Care   Self-Care Other Self-Care Comments    Other Self-Care Comments  see patient education                     PT Education - 05/02/21 1516     Education Details education on current condition, POC, and HEP.    Person(s) Educated Patient    Methods Explanation;Demonstration;Verbal cues;Handout    Comprehension Verbalized understanding;Returned demonstration;Verbal cues required              PT Short Term Goals - 05/02/21 1516       PT SHORT TERM GOAL #1   Title Patient will be independent with initial HEP.    Baseline issued at eval    Status New    Target Date 05/16/21      PT SHORT TERM GOAL #2   Title Patient will demonstrate at least 120 degrees of Lt knee flexion AROM to improve ease of sit<>stand transfer    Baseline 110    Status New    Target Date 05/30/21      PT SHORT TERM GOAL #3   Title Patient will demonstrate full Lt knee extension AROM to improve gait mechanics.    Baseline lacking 8    Status New    Target Date 05/30/21  PT SHORT TERM GOAL #4   Title Patient will complete 5xSTS in less than 12 seconds to signify improvements in functional strength    Baseline 14.3     Status New    Target Date 05/30/21               PT Long Term Goals - 05/02/21 1518       PT LONG TERM GOAL #1   Title Patient will demonstrate normalized gait mechanics without use of brace.    Baseline antalgic with hinge brace    Status New    Target Date 06/27/21      PT LONG TERM GOAL #2   Title Patient will be able to ascend/descend stairs utilizing reciprocal pattern.    Baseline step to pattern    Status New    Target Date 06/27/21      PT LONG TERM GOAL #3   Title Patient will demonstrate at least 4+/5 strength in assessed Lt hip and knee musculature to improve stability with prolonged walking.    Baseline see flowsheet    Status New    Target Date 06/27/21      PT LONG TERM GOAL #4   Title Patient will demonstrate normal squat mechanics without reports of pain.    Baseline valgus collapse, limited squat depth; fearful of full squat currently.    Status New    Target Date 06/27/21                    Plan - 05/02/21 1522     Clinical Impression Statement Patient is a 20 y/o female who presents to OPPT s/p ORIF Lt lateral tibial plateau fracture on 03/16/21 due to a fall. She demonstrates limited Lt knee AROM, hip and knee weakness, gait abnormalities, and balace deficits that are consistent with her recent post-op status. She will benefit from skilled PT to restore her ROM, increase her strength, and normalize her gait mechanics in order to return to PLOF.    Personal Factors and Comorbidities Comorbidity 1    Examination-Activity Limitations Bend;Carry;Lift;Locomotion Level;Squat;Stairs;Stand    Examination-Participation Restrictions Cleaning;Shop;Laundry;Meal Prep    Stability/Clinical Decision Making Stable/Uncomplicated    Clinical Decision Making Low    Rehab Potential Excellent    PT Frequency --   1-2/week   PT Duration Other (comment)   6-8 weeks   PT Treatment/Interventions ADLs/Self Care Home Management;Aquatic  Therapy;Cryotherapy;Electrical Stimulation;Moist Heat;Gait training;Stair training;Functional mobility training;Therapeutic activities;Therapeutic exercise;Balance training;Neuromuscular re-education;Patient/family education;Manual techniques;Passive range of motion;Dry needling;Taping;Vasopneumatic Device    PT Next Visit Plan manual to knee, knee ROM and strengthening, gait trianing    PT Home Exercise Plan Access Code: [email or web address]    Consulted and Agree with Plan of Care Patient             Patient will benefit from skilled therapeutic intervention in order to improve the following deficits and impairments:  Abnormal gait, Decreased range of motion, Difficulty walking, Decreased activity tolerance, Pain, Improper body mechanics, Decreased balance, Decreased strength  Visit Diagnosis: Stiffness of left knee, not elsewhere classified  Acute pain of left knee  Muscle weakness (generalized)  Difficulty in walking, not elsewhere classified     Problem List Patient Active Problem List   Diagnosis Date Noted   Tibia fracture 03/17/2021   Status post fall 03/16/2021   Closed fracture of tibial plateau    Supervision of normal pregnancy 09/28/2020   History of pre-eclampsia 11/29/2019   [facility] Authorization   Choose one: Rehabilitative  Standardized Assessment or Functional Outcome Tool: See Pain Assessment and 5x sit to stand  Score or Percent Disability: n/a  Body Parts Treated (Select each separately):  Knee. Overall deficits/functional limitations for body part selected: moderate N/A. Overall deficits/functional limitations for body part selected: n/a N/A. Overall deficits/functional limitations for body part selected: n/a Letitia Libra, PT, DPT, ATC 05/02/21 4:04 PM   [facility] Outpatient Rehabilitation [facility] [address] [place], [place], [address] Phone: [phone]   Fax:  [phone]  Name: Alexandra Cole MRN: [id number] Date  of Birth: 03/22/2001

## 2021-05-07 ENCOUNTER — Telehealth: Payer: Self-pay

## 2021-05-07 NOTE — Telephone Encounter (Signed)
[employer] representative called and requests medical necessity form be signed by provider for walker and commode. This was ordered by Vergie Living, MD during discharge process from hospital in July 2022. Unable to locate this in office. Requested this be resent via fax; fax number verified with representative.

## 2021-05-10 ENCOUNTER — Other Ambulatory Visit: Payer: Self-pay

## 2021-05-10 ENCOUNTER — Ambulatory Visit: Payer: Medicaid Other

## 2021-05-10 DIAGNOSIS — M25662 Stiffness of left knee, not elsewhere classified: Secondary | ICD-10-CM | POA: Diagnosis not present

## 2021-05-10 DIAGNOSIS — M25562 Pain in left knee: Secondary | ICD-10-CM

## 2021-05-10 DIAGNOSIS — R262 Difficulty in walking, not elsewhere classified: Secondary | ICD-10-CM

## 2021-05-10 DIAGNOSIS — M6281 Muscle weakness (generalized): Secondary | ICD-10-CM

## 2021-05-10 NOTE — Therapy (Signed)
[facility] Outpatient Rehabilitation [facility] [address] [place], [place], [address] Phone: [phone]   Fax:  [phone]  Physical Therapy Treatment  Patient Details  Name: Alexandra Cole MRN: [id number] Date of Birth: 07-19-2001 Referring Provider (PT): Tarry Kos, MD   Encounter Date: 05/10/2021   PT End of Session - 05/10/21 1512     Visit Number 2    Number of Visits 17    Date for PT Re-Evaluation 06/30/21    Authorization Type [employer]    Authorization Time Period 8 visits approved from 05/10/2021-07/09/2021    Authorization - Visit Number 1    Authorization - Number of Visits 8    PT Start Time 1445    PT Stop Time 1540   10 minutes vasopneumatic   PT Time Calculation (min) 55 min    Activity Tolerance Patient tolerated treatment well    Behavior During Therapy [facility] for tasks assessed/performed             History reviewed. No pertinent past medical history.  Past Surgical History:  Procedure Laterality Date   ORIF TIBIA PLATEAU Left 03/16/2021   Procedure: OPEN REDUCTION INTERNAL FIXATION (ORIF) TIBIAL PLATEAU;  Surgeon: Tarry Kos, MD;  Location: [facility] OR;  Service: Orthopedics;  Laterality: Left;    There were no vitals filed for this visit.   Subjective Assessment - 05/10/21 1445     Subjective Pt reports that when bending her L knee, it feels like "something wants to pop," but also adds that she has not had pain over the past week. She reports doing her HEP daily.    Pertinent History recent childbirth on 04/17/21    Limitations Lifting;Standing;Walking;House hold activities    Currently in Pain? No/denies                               [facility] Adult PT Treatment/Exercise - 05/10/21 0001       Knee/Hip Exercises: Stretches   Active Hamstring Stretch Left    Active Hamstring Stretch Limitations Seated with heel on stool, active quad set with towel pull around forefoot 2x60min      Knee/Hip  Exercises: Machines for Strengthening   Cybex Knee Extension eccentric (2 up, 1 down) with 15# cable 2x10 on L    Hip Cybex hip abduction and extension 2x10 BIL each at 25#      Knee/Hip Exercises: Standing   Lateral Step Up Both;3 sets;10 reps;Step Height: 6"    Wall Squat Limitations Wall sit with knees at 90 degrees 3x30sec      Modalities   Modalities Vasopneumatic      Vasopneumatic   Number Minutes Vasopneumatic  10 minutes    Vasopnuematic Location  Knee   L   Vasopneumatic Pressure Medium    Vasopneumatic Temperature  34      Manual Therapy   Manual Therapy Joint mobilization    Joint Mobilization Grade 3 AP/PA tib/fem mobilization at 45 degrees and 10 degrees of knee flexion on L                     PT Education - 05/10/21 1512     Education Details Updated HEP    Person(s) Educated Patient    Methods Explanation;Demonstration;Handout    Comprehension Verbalized understanding;Returned demonstration              PT Short Term Goals - 05/02/21 1516  PT SHORT TERM GOAL #1   Title Patient will be independent with initial HEP.    Baseline issued at eval    Status New    Target Date 05/16/21      PT SHORT TERM GOAL #2   Title Patient will demonstrate at least 120 degrees of Lt knee flexion AROM to improve ease of sit<>stand transfer    Baseline 110    Status New    Target Date 05/30/21      PT SHORT TERM GOAL #3   Title Patient will demonstrate full Lt knee extension AROM to improve gait mechanics.    Baseline lacking 8    Status New    Target Date 05/30/21      PT SHORT TERM GOAL #4   Title Patient will complete 5xSTS in less than 12 seconds to signify improvements in functional strength    Baseline 14.3    Status New    Target Date 05/30/21               PT Long Term Goals - 05/02/21 1518       PT LONG TERM GOAL #1   Title Patient will demonstrate normalized gait mechanics without use of brace.    Baseline antalgic with  hinge brace    Status New    Target Date 06/27/21      PT LONG TERM GOAL #2   Title Patient will be able to ascend/descend stairs utilizing reciprocal pattern.    Baseline step to pattern    Status New    Target Date 06/27/21      PT LONG TERM GOAL #3   Title Patient will demonstrate at least 4+/5 strength in assessed Lt hip and knee musculature to improve stability with prolonged walking.    Baseline see flowsheet    Status New    Target Date 06/27/21      PT LONG TERM GOAL #4   Title Patient will demonstrate normal squat mechanics without reports of pain.    Baseline valgus collapse, limited squat depth; fearful of full squat currently.    Status New    Target Date 06/27/21                   Plan - 05/10/21 1513     Clinical Impression Statement Pt responded well to all interventions today, demonstrating proper form and no increase in pain with selected interventions. She demonstrates good baseline functional strength with the ability to perform eccentric quad strengthening, wall sits, and lateral step-ups with no difficulty. She responded well to manual therapy and demonstrates improved L knee extension AROM following joint mobilization. Following vasopneumatic treatment, L knee swelling was visually improved. She will continue to benefit from skilled PT to address her primary impairments and return to her prior level of function without limitation due to pain.    Personal Factors and Comorbidities Comorbidity 1    Examination-Activity Limitations Bend;Carry;Lift;Locomotion Level;Squat;Stairs;Stand    Examination-Participation Restrictions Cleaning;Shop;Laundry;Meal Prep    Stability/Clinical Decision Making Stable/Uncomplicated    Clinical Decision Making Low    Rehab Potential Excellent    PT Frequency --   1-2x/week   PT Duration --   6-8 weeks   PT Treatment/Interventions ADLs/Self Care Home Management;Aquatic Therapy;Cryotherapy;Electrical Stimulation;Moist  Heat;Gait training;Stair training;Functional mobility training;Therapeutic activities;Therapeutic exercise;Balance training;Neuromuscular re-education;Patient/family education;Manual techniques;Passive range of motion;Dry needling;Taping;Vasopneumatic Device    PT Next Visit Plan manual to knee, knee ROM and strengthening, gait trianing    PT Home Exercise  Plan Access Code: [email or web address]    Consulted and Agree with Plan of Care Patient             Patient will benefit from skilled therapeutic intervention in order to improve the following deficits and impairments:  Abnormal gait, Decreased range of motion, Difficulty walking, Decreased activity tolerance, Pain, Improper body mechanics, Decreased balance, Decreased strength  Visit Diagnosis: Stiffness of left knee, not elsewhere classified  Acute pain of left knee  Muscle weakness (generalized)  Difficulty in walking, not elsewhere classified     Problem List Patient Active Problem List   Diagnosis Date Noted   Tibia fracture 03/17/2021   Status post fall 03/16/2021   Closed fracture of tibial plateau    Supervision of normal pregnancy 09/28/2020   History of pre-eclampsia 11/29/2019    Carmelina Dane, PT, DPT 05/10/21 3:32 PM   [facility] [address] [place], [place], [address] Phone: [phone]   Fax:  [phone]  Name: Brayden Betters MRN: [id number] Date of Birth: 16-Jun-2001

## 2021-05-10 NOTE — Patient Instructions (Signed)
  [id number]

## 2021-05-11 ENCOUNTER — Encounter: Payer: Self-pay | Admitting: Orthopaedic Surgery

## 2021-05-11 ENCOUNTER — Ambulatory Visit: Payer: Self-pay

## 2021-05-11 ENCOUNTER — Ambulatory Visit (INDEPENDENT_AMBULATORY_CARE_PROVIDER_SITE_OTHER): Payer: Medicaid Other | Admitting: Orthopaedic Surgery

## 2021-05-11 DIAGNOSIS — S82142A Displaced bicondylar fracture of left tibia, initial encounter for closed fracture: Secondary | ICD-10-CM | POA: Diagnosis not present

## 2021-05-11 DIAGNOSIS — M25562 Pain in left knee: Secondary | ICD-10-CM | POA: Diagnosis not present

## 2021-05-11 DIAGNOSIS — G8929 Other chronic pain: Secondary | ICD-10-CM

## 2021-05-11 NOTE — Progress Notes (Signed)
   Post-Op Visit Note   Patient: Alexandra Cole           Date of Birth: 09-29-2000           MRN: [id number] Visit Date: 05/11/2021 PCP: Pcp, No   Assessment & Plan:  Chief Complaint:  Chief Complaint  Patient presents with   Left Knee - Pain   Visit Diagnoses:  1. Closed fracture of left tibial plateau, initial encounter   2. Chronic pain of left knee     Plan: Patient is approximately 8 weeks status post ORIF left lateral tibial plateau fracture.  She is doing well and has started physical therapy.  She has been to 2 sessions.  Currently using a hinged knee brace.  She recently had a baby a month ago.  She has no real complaints today.  Left knee shows a fully healed surgical scar.  Range of motion is 2 to greater than 130 degrees.  Mild swelling throughout the knee.  X-rays demonstrate healed fracture.  No hardware complications.  She may wean the brace as tolerated based on progress with PT.  She has already achieved excellent range of motion for 8 weeks postop.  She will continue to increase activity as tolerated.  Recheck 1 more time in about 6 weeks with two-view x-rays of the left knee.  Follow-Up Instructions: Return in about 6 weeks (around 06/22/2021).   Orders:  Orders Placed This Encounter  Procedures   XR Knee 1-2 Views Left   No orders of the defined types were placed in this encounter.   Imaging: XR Knee 1-2 Views Left  Result Date: 05/11/2021 X-rays demonstrate healed lateral tibial plateau fracture.  Stable hardware without complications.   PMFS History: Patient Active Problem List   Diagnosis Date Noted   Tibia fracture 03/17/2021   Status post fall 03/16/2021   Closed fracture of tibial plateau    Supervision of normal pregnancy 09/28/2020   History of pre-eclampsia 11/29/2019   History reviewed. No pertinent past medical history.  History reviewed. No pertinent family history.  Past Surgical History:  Procedure Laterality Date   ORIF  TIBIA PLATEAU Left 03/16/2021   Procedure: OPEN REDUCTION INTERNAL FIXATION (ORIF) TIBIAL PLATEAU;  Surgeon: Tarry Kos, MD;  Location: [facility] OR;  Service: Orthopedics;  Laterality: Left;   Social History   Occupational History   Not on file  Tobacco Use   Smoking status: Never   Smokeless tobacco: Never  Vaping Use   Vaping Use: Never used  Substance and Sexual Activity   Alcohol use: Not Currently   Drug use: Not Currently   Sexual activity: Not on file

## 2021-05-15 ENCOUNTER — Ambulatory Visit: Payer: Medicaid Other

## 2021-05-15 ENCOUNTER — Other Ambulatory Visit: Payer: Self-pay

## 2021-05-15 DIAGNOSIS — R262 Difficulty in walking, not elsewhere classified: Secondary | ICD-10-CM

## 2021-05-15 DIAGNOSIS — M25662 Stiffness of left knee, not elsewhere classified: Secondary | ICD-10-CM

## 2021-05-15 DIAGNOSIS — M25562 Pain in left knee: Secondary | ICD-10-CM

## 2021-05-15 DIAGNOSIS — M6281 Muscle weakness (generalized): Secondary | ICD-10-CM

## 2021-05-15 NOTE — Therapy (Signed)
[facility] Outpatient Rehabilitation [facility] [address] [place], [place], [address] Phone: [phone]   Fax:  [phone]  Physical Therapy Treatment  Patient Details  Name: Alexandra Cole MRN: [id number] Date of Birth: Nov 20, 2000 Referring Provider (PT): Tarry Kos, MD   Encounter Date: 05/15/2021   PT End of Session - 05/15/21 1446     Visit Number 3    Number of Visits 17    Date for PT Re-Evaluation 06/30/21    Authorization Type [employer]    Authorization Time Period 8 visits approved from 05/10/2021-07/09/2021    Authorization - Visit Number 2    Authorization - Number of Visits 8    PT Start Time 1446    PT Stop Time 1530    PT Time Calculation (min) 44 min    Activity Tolerance Patient tolerated treatment well    Behavior During Therapy [facility] for tasks assessed/performed             History reviewed. No pertinent past medical history.  Past Surgical History:  Procedure Laterality Date   ORIF TIBIA PLATEAU Left 03/16/2021   Procedure: OPEN REDUCTION INTERNAL FIXATION (ORIF) TIBIAL PLATEAU;  Surgeon: Tarry Kos, MD;  Location: [facility] OR;  Service: Orthopedics;  Laterality: Left;    There were no vitals filed for this visit.   Subjective Assessment - 05/15/21 1450     Subjective No pain right now and exercises are going well.    Currently in Pain? No/denies                [facility] PT Assessment - 05/15/21 0001       AROM   Left Knee Extension --   lacking 4   Left Knee Flexion 135                           [facility] Adult PT Treatment/Exercise - 05/15/21 0001       Knee/Hip Exercises: Stretches   Passive Hamstring Stretch 60 seconds    Passive Hamstring Stretch Limitations with strap LLE    Hip Flexor Stretch 60 seconds    Hip Flexor Stretch Limitations with strap LLE      Knee/Hip Exercises: Aerobic   Stationary Bike level 2 x 5 minutes    Tread Mill backwards walking x 5 minutes at level 0.5       Knee/Hip Exercises: Machines for Strengthening   Cybex Leg Press 2 x 10 @ 40 lbs      Knee/Hip Exercises: Standing   Terminal Knee Extension 10 reps    Terminal Knee Extension Limitations x2 with ball      Knee/Hip Exercises: Sidelying   Clams 2 x 10 blue band      Manual Therapy   Manual therapy comments knee extension PROM to tolerance    Joint Mobilization Grade II/III AP/PA tib fem mobilization                       PT Short Term Goals - 05/15/21 1503       PT SHORT TERM GOAL #1   Title Patient will be independent with initial HEP.    Baseline issued at eval    Status Achieved    Target Date 05/16/21      PT SHORT TERM GOAL #2   Title Patient will demonstrate at least 120 degrees of Lt knee flexion AROM to improve ease of sit<>stand transfer  Baseline 110    Status Achieved    Target Date 05/30/21      PT SHORT TERM GOAL #3   Title Patient will demonstrate full Lt knee extension AROM to improve gait mechanics.    Baseline lacking 8    Status On-going    Target Date 05/30/21      PT SHORT TERM GOAL #4   Title Patient will complete 5xSTS in less than 12 seconds to signify improvements in functional strength    Baseline 14.3    Status Deferred    Target Date 05/30/21               PT Long Term Goals - 05/02/21 1518       PT LONG TERM GOAL #1   Title Patient will demonstrate normalized gait mechanics without use of brace.    Baseline antalgic with hinge brace    Status New    Target Date 06/27/21      PT LONG TERM GOAL #2   Title Patient will be able to ascend/descend stairs utilizing reciprocal pattern.    Baseline step to pattern    Status New    Target Date 06/27/21      PT LONG TERM GOAL #3   Title Patient will demonstrate at least 4+/5 strength in assessed Lt hip and knee musculature to improve stability with prolonged walking.    Baseline see flowsheet    Status New    Target Date 06/27/21      PT LONG TERM GOAL #4    Title Patient will demonstrate normal squat mechanics without reports of pain.    Baseline valgus collapse, limited squat depth; fearful of full squat currently.    Status New    Target Date 06/27/21                   Plan - 05/15/21 1503     Clinical Impression Statement Patient's Lt knee AROM has much improved compared to baseline demonstrating normalized knee flexion, though still lacks full extension. Continued with knee strenthening and ROM targeting extension based movement to assist in achieving full extension range. She tolerated session well without reports of pain.    PT Treatment/Interventions ADLs/Self Care Home Management;Aquatic Therapy;Cryotherapy;Electrical Stimulation;Moist Heat;Gait training;Stair training;Functional mobility training;Therapeutic activities;Therapeutic exercise;Balance training;Neuromuscular re-education;Patient/family education;Manual techniques;Passive range of motion;Dry needling;Taping;Vasopneumatic Device    PT Next Visit Plan manual to knee, knee ROM and strengthening, gait trianing    PT Home Exercise Plan Access Code: [email or web address]    Consulted and Agree with Plan of Care Patient             Patient will benefit from skilled therapeutic intervention in order to improve the following deficits and impairments:  Abnormal gait, Decreased range of motion, Difficulty walking, Decreased activity tolerance, Pain, Improper body mechanics, Decreased balance, Decreased strength  Visit Diagnosis: Stiffness of left knee, not elsewhere classified  Acute pain of left knee  Muscle weakness (generalized)  Difficulty in walking, not elsewhere classified     Problem List Patient Active Problem List   Diagnosis Date Noted   Tibia fracture 03/17/2021   Status post fall 03/16/2021   Closed fracture of tibial plateau    Supervision of normal pregnancy 09/28/2020   History of pre-eclampsia 11/29/2019   Letitia Libra, PT, DPT, ATC 05/15/21 3:38  PM   [facility] [address] [place], [place], [address] Phone: [phone]   Fax:  [phone]  Name: Alexandra Cole  MRN: [id number] Date of Birth: May 15, 2001

## 2021-05-17 ENCOUNTER — Ambulatory Visit: Payer: Medicaid Other

## 2021-05-17 ENCOUNTER — Other Ambulatory Visit: Payer: Self-pay

## 2021-05-17 DIAGNOSIS — M25662 Stiffness of left knee, not elsewhere classified: Secondary | ICD-10-CM

## 2021-05-17 DIAGNOSIS — M6281 Muscle weakness (generalized): Secondary | ICD-10-CM

## 2021-05-17 DIAGNOSIS — R262 Difficulty in walking, not elsewhere classified: Secondary | ICD-10-CM

## 2021-05-17 DIAGNOSIS — M25562 Pain in left knee: Secondary | ICD-10-CM

## 2021-05-17 NOTE — Patient Instructions (Signed)
  [id number] 

## 2021-05-17 NOTE — Therapy (Signed)
[facility] Out[facility] [address] [place], [place], [address] Phone: [phone]   Fax:  [phone]  Physical Therapy Treatment  Patient Details  Name: Alexandra Cole MRN: [id number] Date of Birth: 11-01-2000 Referring Provider (PT): Tarry Kos, MD   Encounter Date: 05/17/2021   PT End of Session - 05/17/21 1510     Visit Number 4    Number of Visits 17    Date for PT Re-Evaluation 06/30/21    Authorization Type [employer]    Authorization Time Period 8 visits approved from 05/10/2021-07/09/2021    Authorization - Visit Number 3    Authorization - Number of Visits 8    PT Start Time 1445    PT Stop Time 1530    PT Time Calculation (min) 45 min    Activity Tolerance Patient tolerated treatment well    Behavior During Therapy [facility] for tasks assessed/performed             History reviewed. No pertinent past medical history.  Past Surgical History:  Procedure Laterality Date   ORIF TIBIA PLATEAU Left 03/16/2021   Procedure: OPEN REDUCTION INTERNAL FIXATION (ORIF) TIBIAL PLATEAU;  Surgeon: Tarry Kos, MD;  Location: [facility] OR;  Service: Orthopedics;  Laterality: Left;    There were no vitals filed for this visit.   Subjective Assessment - 05/17/21 1447     Subjective Pt reports no pain over the past few days, adding that she has done her exercises daily.    Currently in Pain? No/denies                               [facility] Adult PT Treatment/Exercise - 05/17/21 0001       Knee/Hip Exercises: Aerobic   Tread Mill backwards walking x 5 minutes at      Knee/Hip Exercises: Machines for Strengthening   Hip Cybex hip abduction and extension 2x10 BIL each at 37.5#      Knee/Hip Exercises: Plyometrics   Other Plyometric Exercises Squat jump with dynamic terminal knee extension stretch 3x10    Other Plyometric Exercises Toy soldiers 3x10      Knee/Hip Exercises: Standing   Other Standing  Knee Exercises Single leg dead lift with explosive ascension 3x8 BIL      Manual Therapy   Manual Therapy Joint mobilization    Joint Mobilization Grade IV PA tib-fem mobilization x8 minutes                     PT Education - 05/17/21 1510     Education Details Updated HEP    Person(s) Educated Patient    Methods Explanation;Demonstration;Handout    Comprehension Verbalized understanding;Returned demonstration              PT Short Term Goals - 05/15/21 1503       PT SHORT TERM GOAL #1   Title Patient will be independent with initial HEP.    Baseline issued at eval    Status Achieved    Target Date 05/16/21      PT SHORT TERM GOAL #2   Title Patient will demonstrate at least 120 degrees of Lt knee flexion AROM to improve ease of sit<>stand transfer    Baseline 110    Status Achieved    Target Date 05/30/21      PT SHORT TERM GOAL #3   Title Patient will demonstrate full Lt  knee extension AROM to improve gait mechanics.    Baseline lacking 8    Status On-going    Target Date 05/30/21      PT SHORT TERM GOAL #4   Title Patient will complete 5xSTS in less than 12 seconds to signify improvements in functional strength    Baseline 14.3    Status Deferred    Target Date 05/30/21               PT Long Term Goals - 05/02/21 1518       PT LONG TERM GOAL #1   Title Patient will demonstrate normalized gait mechanics without use of brace.    Baseline antalgic with hinge brace    Status New    Target Date 06/27/21      PT LONG TERM GOAL #2   Title Patient will be able to ascend/descend stairs utilizing reciprocal pattern.    Baseline step to pattern    Status New    Target Date 06/27/21      PT LONG TERM GOAL #3   Title Patient will demonstrate at least 4+/5 strength in assessed Lt hip and knee musculature to improve stability with prolonged walking.    Baseline see flowsheet    Status New    Target Date 06/27/21      PT LONG TERM GOAL #4    Title Patient will demonstrate normal squat mechanics without reports of pain.    Baseline valgus collapse, limited squat depth; fearful of full squat currently.    Status New    Target Date 06/27/21                   Plan - 05/17/21 1511     Clinical Impression Statement Pt's L knee extension AROM improved from 4 degrees to 2 degrees following grade IV PA tibio-femoral joint mobilizations. Additionally, she demonstrates improved funtional terminal knee extension with backwards treadmill walking and squat jumps. She reports mild increase in pain from 0/10 to 1/10 during squat jumps, although this returned to baseline shortly after completing the exercise. She will continue to benefit from skilled PT in order to address her remaining functional impairments and return to her prior level of funtion without limitation.    Personal Factors and Comorbidities Comorbidity 1    Examination-Activity Limitations Bend;Carry;Lift;Locomotion Level;Squat;Stairs;Stand    Examination-Participation Restrictions Cleaning;Shop;Laundry;Meal Prep    Stability/Clinical Decision Making Stable/Uncomplicated    Clinical Decision Making Low    Rehab Potential Excellent    PT Frequency --   1-2x/week   PT Duration --   6-8 weeks   PT Treatment/Interventions ADLs/Self Care Home Management;Aquatic Therapy;Cryotherapy;Electrical Stimulation;Moist Heat;Gait training;Stair training;Functional mobility training;Therapeutic activities;Therapeutic exercise;Balance training;Neuromuscular re-education;Patient/family education;Manual techniques;Passive range of motion;Dry needling;Taping;Vasopneumatic Device    PT Next Visit Plan manual to knee, knee ROM and strengthening, gait training    PT Home Exercise Plan Access Code: [email or web address]    Consulted and Agree with Plan of Care Patient             Patient will benefit from skilled therapeutic intervention in order to improve the following deficits and impairments:   Abnormal gait, Decreased range of motion, Difficulty walking, Decreased activity tolerance, Pain, Improper body mechanics, Decreased balance, Decreased strength  Visit Diagnosis: Stiffness of left knee, not elsewhere classified  Acute pain of left knee  Muscle weakness (generalized)  Difficulty in walking, not elsewhere classified     Problem List Patient Active Problem List   Diagnosis Date Noted  Tibia fracture 03/17/2021   Status post fall 03/16/2021   Closed fracture of tibial plateau    Supervision of normal pregnancy 09/28/2020   History of pre-eclampsia 11/29/2019    Carmelina Dane, PT, DPT 05/17/21 3:32 PM   [facility] [address] [place], [place], [address] Phone: [phone]   Fax:  [phone]  Name: Melissia Lahman MRN: [id number] Date of Birth: 2000-12-15

## 2021-05-22 ENCOUNTER — Other Ambulatory Visit: Payer: Self-pay

## 2021-05-22 ENCOUNTER — Ambulatory Visit: Payer: Medicaid Other | Attending: Orthopaedic Surgery

## 2021-05-22 DIAGNOSIS — R262 Difficulty in walking, not elsewhere classified: Secondary | ICD-10-CM | POA: Insufficient documentation

## 2021-05-22 DIAGNOSIS — M25662 Stiffness of left knee, not elsewhere classified: Secondary | ICD-10-CM | POA: Insufficient documentation

## 2021-05-22 DIAGNOSIS — M6281 Muscle weakness (generalized): Secondary | ICD-10-CM | POA: Insufficient documentation

## 2021-05-22 DIAGNOSIS — M25562 Pain in left knee: Secondary | ICD-10-CM | POA: Insufficient documentation

## 2021-05-22 NOTE — Therapy (Signed)
[facility] Out[facility] Rehabilitation [facility] [address] [place], [place], [address] Phone: [phone]   Fax:  [phone]  Physical Therapy Treatment  Patient Details  Name: Alexandra Cole MRN: [id number] Date of Birth: 2001/08/17 Referring Provider (PT): Tarry Kos, MD   Encounter Date: 05/22/2021   PT End of Session - 05/22/21 1449     Visit Number 5    Number of Visits 17    Date for PT Re-Evaluation 06/30/21    Authorization Type [employer]    Authorization Time Period 8 visits approved from 05/10/2021-07/09/2021    Authorization - Visit Number 4    Authorization - Number of Visits 8    PT Start Time 1448    PT Stop Time 1527    PT Time Calculation (min) 39 min    Activity Tolerance Patient tolerated treatment well    Behavior During Therapy [facility] for tasks assessed/performed             History reviewed. No pertinent past medical history.  Past Surgical History:  Procedure Laterality Date   ORIF TIBIA PLATEAU Left 03/16/2021   Procedure: OPEN REDUCTION INTERNAL FIXATION (ORIF) TIBIAL PLATEAU;  Surgeon: Tarry Kos, MD;  Location: [facility] OR;  Service: Orthopedics;  Laterality: Left;    There were no vitals filed for this visit.   Subjective Assessment - 05/22/21 1450     Subjective Patient reports no pain currently. She does get some pain/soreness in the knee after completing her HEP. She had swelling in her foot after last session that lasted about a day.    Currently in Pain? No/denies                [facility] PT Assessment - 05/22/21 0001       AROM   Left Knee Extension --   lacking 2                          [facility] Adult PT Treatment/Exercise - 05/22/21 0001       Knee/Hip Exercises: Stretches   Gastroc Stretch 60 seconds      Knee/Hip Exercises: Aerobic   Stationary Bike level 4 x 5 minutes      Knee/Hip Exercises: Machines for Strengthening   Cybex Knee Extension attempted LLE unable with 5  lbs      Knee/Hip Exercises: Plyometrics   Other Plyometric Exercises squat jump 1 x 6 pain      Knee/Hip Exercises: Standing   Terminal Knee Extension 10 reps    Theraband Level (Terminal Knee Extension) Level 4 (Blue)    Terminal Knee Extension Limitations x2; LLE      Knee/Hip Exercises: Seated   [employer] --   attempted with resistance band (pain)   Long Arc Quad Weight 4 lbs.    Long [employer] Limitations 2 x 12 LLE    Sit to [employer] 10 reps   x2; with blue band at thighs     Knee/Hip Exercises: Supine   Straight Leg Raises 10 reps    Straight Leg Raises Limitations x2; LLE    Other Supine Knee/Hip Exercises hamstring curl on stability ball 2 x 10                     PT Education - 05/22/21 1527     Education Details Reviewed and updated HEP    Person(s) Educated Patient    Methods Explanation;Demonstration;Verbal cues;Handout  Comprehension Verbalized understanding;Returned demonstration;Verbal cues required              PT Short Term Goals - 05/15/21 1503       PT SHORT TERM GOAL #1   Title Patient will be independent with initial HEP.    Baseline issued at eval    Status Achieved    Target Date 05/16/21      PT SHORT TERM GOAL #2   Title Patient will demonstrate at least 120 degrees of Lt knee flexion AROM to improve ease of sit<>stand transfer    Baseline 110    Status Achieved    Target Date 05/30/21      PT SHORT TERM GOAL #3   Title Patient will demonstrate full Lt knee extension AROM to improve gait mechanics.    Baseline lacking 8    Status On-going    Target Date 05/30/21      PT SHORT TERM GOAL #4   Title Patient will complete 5xSTS in less than 12 seconds to signify improvements in functional strength    Baseline 14.3    Status Deferred    Target Date 05/30/21               PT Long Term Goals - 05/02/21 1518       PT LONG TERM GOAL #1   Title Patient will demonstrate normalized gait mechanics without use of brace.     Baseline antalgic with hinge brace    Status New    Target Date 06/27/21      PT LONG TERM GOAL #2   Title Patient will be able to ascend/descend stairs utilizing reciprocal pattern.    Baseline step to pattern    Status New    Target Date 06/27/21      PT LONG TERM GOAL #3   Title Patient will demonstrate at least 4+/5 strength in assessed Lt hip and knee musculature to improve stability with prolonged walking.    Baseline see flowsheet    Status New    Target Date 06/27/21      PT LONG TERM GOAL #4   Title Patient will demonstrate normal squat mechanics without reports of pain.    Baseline valgus collapse, limited squat depth; fearful of full squat currently.    Status New    Target Date 06/27/21                   Plan - 05/22/21 1459     Clinical Impression Statement Patient's Lt knee extension AROM continues to improve, lacking 2 degrees upon assessment today. Continued with progressing of knee strengthening today, which she tolerated well without reports of pain. She was unable to complete resisted knee extension on cybex with 5 lbs due to weakness, but able to perform LAQ with 4 lbs withou issue. Reviewed HEP as patient reports having knee pain with some of her exercises. With sit to stand she has notable valgus collapse bilaterally and requires cues for pacing. With use of theraband for tactile cues to maintain she alignment she has proper form with sit to stand. She has pain with LAQ with resistance band as well as squat jumps, so these exercises were removed from HEP for now.    Personal Factors and Comorbidities --    Examination-Activity Limitations --    Examination-Participation Restrictions --    Stability/Clinical Decision Making --    Rehab Potential --    PT Frequency --   1-2x/week   PT  Duration --   6-8 weeks   PT Treatment/Interventions ADLs/Self Care Home Management;Aquatic Therapy;Cryotherapy;Electrical Stimulation;Moist Heat;Gait training;Stair  training;Functional mobility training;Therapeutic activities;Therapeutic exercise;Balance training;Neuromuscular re-education;Patient/family education;Manual techniques;Passive range of motion;Dry needling;Taping;Vasopneumatic Device    PT Next Visit Plan manual to knee, knee ROM and strengthening, gait training    PT Home Exercise Plan Access Code: [email or web address]    Consulted and Agree with Plan of Care Patient             Patient will benefit from skilled therapeutic intervention in order to improve the following deficits and impairments:  Abnormal gait, Decreased range of motion, Difficulty walking, Decreased activity tolerance, Pain, Improper body mechanics, Decreased balance, Decreased strength  Visit Diagnosis: Stiffness of left knee, not elsewhere classified  Acute pain of left knee  Muscle weakness (generalized)  Difficulty in walking, not elsewhere classified     Problem List Patient Active Problem List   Diagnosis Date Noted   Tibia fracture 03/17/2021   Status post fall 03/16/2021   Closed fracture of tibial plateau    Supervision of normal pregnancy 09/28/2020   History of pre-eclampsia 11/29/2019   Letitia Libra, PT, DPT, ATC 05/22/21 3:28 PM  [facility] [address] [place], [place], [address] Phone: [phone]   Fax:  [phone]  Name: Joyanna Kleman MRN: [id number] Date of Birth: Nov 05, 2000

## 2021-05-25 ENCOUNTER — Encounter: Payer: Self-pay | Admitting: Radiology

## 2021-05-29 ENCOUNTER — Other Ambulatory Visit: Payer: Self-pay

## 2021-05-29 ENCOUNTER — Ambulatory Visit: Payer: Medicaid Other

## 2021-05-29 DIAGNOSIS — M25562 Pain in left knee: Secondary | ICD-10-CM

## 2021-05-29 DIAGNOSIS — M25662 Stiffness of left knee, not elsewhere classified: Secondary | ICD-10-CM

## 2021-05-29 DIAGNOSIS — R262 Difficulty in walking, not elsewhere classified: Secondary | ICD-10-CM

## 2021-05-29 DIAGNOSIS — M6281 Muscle weakness (generalized): Secondary | ICD-10-CM

## 2021-05-29 NOTE — Therapy (Signed)
[facility] Outpatient Rehabilitation [facility] [address] [place], [place], [address] Phone: [phone]   Fax:  [phone]  Physical Therapy Treatment  Patient Details  Name: Alexandra Cole MRN: [id number] Date of Birth: 2001-07-30 Referring Provider (PT): Tarry Kos, MD   Encounter Date: 05/29/2021   PT End of Session - 05/29/21 1446     Visit Number 6    Number of Visits 17    Date for PT Re-Evaluation 06/30/21    Authorization Type [employer]    Authorization Time Period 8 visits approved from 05/10/2021-07/09/2021    Authorization - Visit Number 5    Authorization - Number of Visits 8    PT Start Time 1446    PT Stop Time 1526    PT Time Calculation (min) 40 min    Activity Tolerance Patient tolerated treatment well    Behavior During Therapy [facility] for tasks assessed/performed             History reviewed. No pertinent past medical history.  Past Surgical History:  Procedure Laterality Date   ORIF TIBIA PLATEAU Left 03/16/2021   Procedure: OPEN REDUCTION INTERNAL FIXATION (ORIF) TIBIAL PLATEAU;  Surgeon: Tarry Kos, MD;  Location: [facility] OR;  Service: Orthopedics;  Laterality: Left;    There were no vitals filed for this visit.   Subjective Assessment - 05/29/21 1449     Subjective She reports overall the knee has been feeling good, but when she does some of the exercises it feels bothersome when she straightens out the knee along the medial aspect of the anterior knee.    Currently in Pain? No/denies                [facility] PT Assessment - 05/29/21 0001       AROM   Left Knee Extension 0                           [facility] Adult PT Treatment/Exercise - 05/29/21 0001       Self-Care   Other Self-Care Comments  see patient education      Knee/Hip Exercises: Stretches   Passive Hamstring Stretch 60 seconds    Passive Hamstring Stretch Limitations with strap LLE      Knee/Hip Exercises: Aerobic    Stationary Bike level 4 x 5 minutes      Knee/Hip Exercises: Machines for Strengthening   Cybex Leg Press 2 x 8 SL LLE 20 lbs      Knee/Hip Exercises: Standing   Step Down Limitations lateral step down 2 inch step 2 x 10 LLE (attempted 4 inch); forward step down 2 x 10 LLE 2 inch step    Other Standing Knee Exercises lateral band walks 2 sets d/b x 15 ft green band at shins    Other Standing Knee Exercises opposite touchdown 2 x 10 LLE 13 tall cones      Knee/Hip Exercises: Supine   Straight Leg Raises 10 reps    Straight Leg Raises Limitations x2; LLE                     PT Education - 05/29/21 1527     Education Details Updated HEP    Person(s) Educated Patient    Methods Explanation;Demonstration;Verbal cues;Handout    Comprehension Verbalized understanding;Returned demonstration;Verbal cues required              PT Short Term Goals - 05/29/21 1516  PT SHORT TERM GOAL #1   Title Patient will be independent with initial HEP.    Baseline issued at eval    Status Achieved    Target Date 05/16/21      PT SHORT TERM GOAL #2   Title Patient will demonstrate at least 120 degrees of Lt knee flexion AROM to improve ease of sit<>stand transfer    Baseline 110    Status Achieved    Target Date 05/30/21      PT SHORT TERM GOAL #3   Title Patient will demonstrate full Lt knee extension AROM to improve gait mechanics.    Baseline lacking 8    Status Achieved    Target Date 05/30/21      PT SHORT TERM GOAL #4   Title Patient will complete 5xSTS in less than 12 seconds to signify improvements in functional strength    Baseline 14.3    Status Deferred    Target Date 05/30/21               PT Long Term Goals - 05/02/21 1518       PT LONG TERM GOAL #1   Title Patient will demonstrate normalized gait mechanics without use of brace.    Baseline antalgic with hinge brace    Status New    Target Date 06/27/21      PT LONG TERM GOAL #2   Title  Patient will be able to ascend/descend stairs utilizing reciprocal pattern.    Baseline step to pattern    Status New    Target Date 06/27/21      PT LONG TERM GOAL #3   Title Patient will demonstrate at least 4+/5 strength in assessed Lt hip and knee musculature to improve stability with prolonged walking.    Baseline see flowsheet    Status New    Target Date 06/27/21      PT LONG TERM GOAL #4   Title Patient will demonstrate normal squat mechanics without reports of pain.    Baseline valgus collapse, limited squat depth; fearful of full squat currently.    Status New    Target Date 06/27/21                   Plan - 05/29/21 1451     Clinical Impression Statement Reviewed HEP with patient reporting hamstring stretching and SLR caused a rubbing sensation about the proximal medial aspect of the tibia. It is likely that this rubbing could be related to her hardware when she is in full knee extension. She demonstrates improved mechanics with sit to stand. Able to progress towards SL CKC strengthening about the LLE without reports of pain, though quickly fatigues. Attempted lateral step down from 4 inch step, but she is unable to control for hip drop. With step down from 2 inch step she is able to properly perform with good eccentric quad control.    PT Frequency --   1-2x/week   PT Duration --   6-8 weeks   PT Treatment/Interventions ADLs/Self Care Home Management;Aquatic Therapy;Cryotherapy;Electrical Stimulation;Moist Heat;Gait training;Stair training;Functional mobility training;Therapeutic activities;Therapeutic exercise;Balance training;Neuromuscular re-education;Patient/family education;Manual techniques;Passive range of motion;Dry needling;Taping;Vasopneumatic Device    PT Next Visit Plan manual to knee, knee ROM and strengthening, gait training    PT Home Exercise Plan Access Code: [email or web address]    Consulted and Agree with Plan of Care Patient             Patient will  benefit from skilled therapeutic intervention  in order to improve the following deficits and impairments:  Abnormal gait, Decreased range of motion, Difficulty walking, Decreased activity tolerance, Pain, Improper body mechanics, Decreased balance, Decreased strength  Visit Diagnosis: Stiffness of left knee, not elsewhere classified  Acute pain of left knee  Muscle weakness (generalized)  Difficulty in walking, not elsewhere classified     Problem List Patient Active Problem List   Diagnosis Date Noted   Tibia fracture 03/17/2021   Status post fall 03/16/2021   Closed fracture of tibial plateau    Supervision of normal pregnancy 09/28/2020   History of pre-eclampsia 11/29/2019  Letitia Libra, PT, DPT, ATC 05/29/21 3:29 PM   [facility] Rehabilitation [facility] [address] [place], [place], [address] Phone: [phone]   Fax:  [phone]  Name: Alexandra Cole MRN: [id number] Date of Birth: 2001/03/06

## 2021-06-05 ENCOUNTER — Other Ambulatory Visit: Payer: Self-pay

## 2021-06-05 ENCOUNTER — Ambulatory Visit: Payer: Medicaid Other

## 2021-06-05 DIAGNOSIS — M6281 Muscle weakness (generalized): Secondary | ICD-10-CM

## 2021-06-05 DIAGNOSIS — M25562 Pain in left knee: Secondary | ICD-10-CM

## 2021-06-05 DIAGNOSIS — M25662 Stiffness of left knee, not elsewhere classified: Secondary | ICD-10-CM | POA: Diagnosis not present

## 2021-06-05 DIAGNOSIS — R262 Difficulty in walking, not elsewhere classified: Secondary | ICD-10-CM

## 2021-06-05 NOTE — Therapy (Signed)
[place], [place], [address] Phone: [phone]   Fax:  [phone]  Physical Therapy Treatment  Patient Details  Name: Alexandra Cole MRN: [id number] Date of Birth: 2001-07-13 Referring Provider (PT): Leandrew Koyanagi, MD   Encounter Date: 06/05/2021   PT End of Session - 06/05/21 1444     Visit Number 7    Number of Visits 17    Date for PT Re-Evaluation 06/30/21    Authorization Type [employer]    Authorization Time Period 8 visits approved from 05/10/2021-07/09/2021    Authorization - Visit Number 6    Authorization - Number of Visits 8    PT Start Time 1638    PT Stop Time 1526    PT Time Calculation (min) 41 min    Activity Tolerance Patient tolerated treatment well    Behavior During Therapy [facility] for tasks assessed/performed             History reviewed. No pertinent past medical history.  Past Surgical History:  Procedure Laterality Date   ORIF TIBIA PLATEAU Left 03/16/2021   Procedure: OPEN REDUCTION INTERNAL FIXATION (ORIF) TIBIAL PLATEAU;  Surgeon: Leandrew Koyanagi, MD;  Location: [place] [facility];  Service: Orthopedics;  Laterality: Left;    There were no vitals filed for this visit.   Subjective Assessment - 06/05/21 1449     Subjective Patient reports the knee is feeling good right now without pain. She did hit the knee on a grocery cart a few days ago, which did cause some discomfort when performing her exercises, but this has since resolved.    Currently in Pain? No/denies                [facility] PT Assessment - 06/05/21 0001       Transfers   Five time sit to stand comments  8.5 seconds                           [facility] Adult PT Treatment/Exercise - 06/05/21 0001       Self-Care   Other Self-Care Comments  see patient education      Knee/Hip Exercises: Aerobic   Stationary Bike level 4 x 5 minutes      Knee/Hip Exercises: Machines for Strengthening    Cybex Knee Extension LLE only 2 x 10 @ 5 lbs    Cybex Knee Flexion 2 x 10 bilateral @ 20 lbs      Knee/Hip Exercises: Standing   Step Down Limitations lateral step down 4 inch 2 x10; forward step down 4 inch 2 x 10    Other Standing Knee Exercises reverse lunge on slider 2 x 10 bilateral      Knee/Hip Exercises: Sidelying   Other Sidelying Knee/Hip Exercises sidelying leg taps fwd/bwd 2 x 10 bilateral                     PT Education - 06/05/21 1517     Education Details updated HEP.    Person(s) Educated Patient    Methods Explanation;Demonstration;Verbal cues;Handout    Comprehension Verbalized understanding;Returned demonstration;Verbal cues required              PT Short Term Goals - 06/05/21 1457       PT SHORT TERM GOAL #1   Title Patient will be independent with initial HEP.    Baseline issued at eval    Status Achieved  Target Date 05/16/21      PT SHORT TERM GOAL #2   Title Patient will demonstrate at least 120 degrees of Lt knee flexion AROM to improve ease of sit<>stand transfer    Baseline 110    Status Achieved    Target Date 05/30/21      PT SHORT TERM GOAL #3   Title Patient will demonstrate full Lt knee extension AROM to improve gait mechanics.    Baseline lacking 8    Status Achieved    Target Date 05/30/21      PT SHORT TERM GOAL #4   Title Patient will complete 5xSTS in less than 12 seconds to signify improvements in functional strength    Baseline 14.3    Status Achieved    Target Date 05/30/21               PT Long Term Goals - 05/02/21 1518       PT LONG TERM GOAL #1   Title Patient will demonstrate normalized gait mechanics without use of brace.    Baseline antalgic with hinge brace    Status New    Target Date 06/27/21      PT LONG TERM GOAL #2   Title Patient will be able to ascend/descend stairs utilizing reciprocal pattern.    Baseline step to pattern    Status New    Target Date 06/27/21      PT LONG  TERM GOAL #3   Title Patient will demonstrate at least 4+/5 strength in assessed Lt hip and knee musculature to improve stability with prolonged walking.    Baseline see flowsheet    Status New    Target Date 06/27/21      PT LONG TERM GOAL #4   Title Patient will demonstrate normal squat mechanics without reports of pain.    Baseline valgus collapse, limited squat depth; fearful of full squat currently.    Status New    Target Date 06/27/21                   Plan - 06/05/21 1451     Clinical Impression Statement Patient's 5xSTS has much improved compared to baseline, having met this short term functional goal. She was able to complete resisted knee extension through partial range without pain, though unable to complete end range of extension due to patella discomfort. Mechanics are improving with CKC activity with minimal knee valgus noted with closed chain strengthening today. She tolerated session well reporting discomfort at end range of extension during resisted knee extension, otherwise no reports of pain. She did report feelings of weakness/"wobbly" about the Lt knee with closed chain strengthening.    PT Frequency --   1-2x/week   PT Duration --   6-8 weeks   PT Treatment/Interventions ADLs/Self Care Home Management;Aquatic Therapy;Cryotherapy;Electrical Stimulation;Moist Heat;Gait training;Stair training;Functional mobility training;Therapeutic activities;Therapeutic exercise;Balance training;Neuromuscular re-education;Patient/family education;Manual techniques;Passive range of motion;Dry needling;Taping;Vasopneumatic Device    PT Next Visit Plan manual to knee, knee ROM and strengthening, gait training    PT Home Exercise Plan Access Code: [email or web address]    Consulted and Agree with Plan of Care Patient             Patient will benefit from skilled therapeutic intervention in order to improve the following deficits and impairments:  Abnormal gait, Decreased range of motion,  Difficulty walking, Decreased activity tolerance, Pain, Improper body mechanics, Decreased balance, Decreased strength  Visit Diagnosis: Stiffness of left knee, not elsewhere classified  Acute pain of left knee  Muscle weakness (generalized)  Difficulty in walking, not elsewhere classified     Problem List Patient Active Problem List   Diagnosis Date Noted   Tibia fracture 03/17/2021   Status post fall 03/16/2021   Closed fracture of tibial plateau    Supervision of normal pregnancy 09/28/2020   History of pre-eclampsia 11/29/2019   Gwendolyn Grant, PT, DPT, ATC 06/05/21 3:27 PM   [facility] Outpatient Rehabilitation [facility] [address] [place], [place], [address] Phone: [phone]   Fax:  [phone]  Name: Alexandra Cole MRN: [id number] Date of Birth: [birth year]

## 2021-06-12 ENCOUNTER — Other Ambulatory Visit: Payer: Self-pay

## 2021-06-12 ENCOUNTER — Ambulatory Visit: Payer: Medicaid Other

## 2021-06-12 DIAGNOSIS — M6281 Muscle weakness (generalized): Secondary | ICD-10-CM

## 2021-06-12 DIAGNOSIS — M25562 Pain in left knee: Secondary | ICD-10-CM

## 2021-06-12 DIAGNOSIS — M25662 Stiffness of left knee, not elsewhere classified: Secondary | ICD-10-CM

## 2021-06-12 DIAGNOSIS — R262 Difficulty in walking, not elsewhere classified: Secondary | ICD-10-CM

## 2021-06-12 NOTE — Therapy (Signed)
[facility] Outpatient Rehabilitation [facility] [address] [place], [place], [address] Phone: [phone]   Fax:  [phone]  Physical Therapy Treatment  Patient Details  Name: Alexandra Cole MRN: [id number] Date of Birth: [birth year] Referring Provider (PT): Tarry Kos, MD   Encounter Date: 06/12/2021   PT End of Session - 06/12/21 1444     Visit Number 8    Number of Visits 17    Date for PT Re-Evaluation 06/30/21    Authorization Type [employer]    Authorization Time Period 8 visits approved from 05/10/2021-07/09/2021    Authorization - Visit Number 7    Authorization - Number of Visits 8    PT Start Time 1444    PT Stop Time 1526    PT Time Calculation (min) 42 min    Activity Tolerance Patient tolerated treatment well    Behavior During Therapy [facility] for tasks assessed/performed             History reviewed. No pertinent past medical history.  Past Surgical History:  Procedure Laterality Date   ORIF TIBIA PLATEAU Left 03/16/2021   Procedure: OPEN REDUCTION INTERNAL FIXATION (ORIF) TIBIAL PLATEAU;  Surgeon: Tarry Kos, MD;  Location: [facility] OR;  Service: Orthopedics;  Laterality: Left;    There were no vitals filed for this visit.   Subjective Assessment - 06/12/21 1450     Subjective Patient reports her knee is feeling good today, but that when she does the opposite touchdowns that were added to her HEP last session. Patient reports feeling "pressure" type pain in the anterior portion of her knee.    Currently in Pain? No/denies                [facility] PT Assessment - 06/12/21 0001       Squat   Comments min valgus collapse, slight aberrant movement. min trun klean.                           [facility] Adult PT Treatment/Exercise - 06/12/21 0001       Self-Care   Other Self-Care Comments  see patient education      Knee/Hip Exercises: Aerobic   Stationary Bike level 4 x 5 minutes      Knee/Hip Exercises:  Machines for Strengthening   Cybex Knee Extension LLE only 3x10 5lbs    Cybex Knee Flexion 3x10 bilaterally 20lbs      Knee/Hip Exercises: Standing   Step Down Limitations lateral step down from 4inch step 2x10; forward step down 3x10 4inch    Other Standing Knee Exercises reverse lunges 2x10 with slider; opposite touchdown 2x10 bilaterally 12 tall cones.    Other Standing Knee Exercises lateral, forward, backward walks with blue band. 56ftx4 each      Knee/Hip Exercises: Seated   Sit to Sand 10 reps   10lbs 3x10     Knee/Hip Exercises: Sidelying   Other Sidelying Knee/Hip Exercises --                       PT Short Term Goals - 06/05/21 1457       PT SHORT TERM GOAL #1   Title Patient will be independent with initial HEP.    Baseline issued at eval    Status Achieved    Target Date 05/16/21      PT SHORT TERM GOAL #2   Title Patient will demonstrate at least 120  degrees of Lt knee flexion AROM to improve ease of sit<>stand transfer    Baseline 110    Status Achieved    Target Date 05/30/21      PT SHORT TERM GOAL #3   Title Patient will demonstrate full Lt knee extension AROM to improve gait mechanics.    Baseline lacking 8    Status Achieved    Target Date 05/30/21      PT SHORT TERM GOAL #4   Title Patient will complete 5xSTS in less than 12 seconds to signify improvements in functional strength    Baseline 14.3    Status Achieved    Target Date 05/30/21               PT Long Term Goals - 05/02/21 1518       PT LONG TERM GOAL #1   Title Patient will demonstrate normalized gait mechanics without use of brace.    Baseline antalgic with hinge brace    Status New    Target Date 06/27/21      PT LONG TERM GOAL #2   Title Patient will be able to ascend/descend stairs utilizing reciprocal pattern.    Baseline step to pattern    Status New    Target Date 06/27/21      PT LONG TERM GOAL #3   Title Patient will demonstrate at least 4+/5  strength in assessed Lt hip and knee musculature to improve stability with prolonged walking.    Baseline see flowsheet    Status New    Target Date 06/27/21      PT LONG TERM GOAL #4   Title Patient will demonstrate normal squat mechanics without reports of pain.    Baseline valgus collapse, limited squat depth; fearful of full squat currently.    Status New    Target Date 06/27/21                   Plan - 06/12/21 1534     Clinical Impression Statement Patients squat form has improved compared to baseline and patient reports no pain throughout the session. Patient able to completed resisted knee extension and flexion throughout range of motion without pain and reports of moderate muscle fatigue by end of sets. Patient require moderate verbal cues for upright trunk during all squat activites throughout session with improvement in posture during squat by the end of the session. Patient tolerated lateral, forward, and backward blue banded walking with moderate muscle fatigue and no reports of pain. Reviewed HEP as patient reported pain with opposite touchdowns as part of HEP over the past week. When initially performing she demonstrates excessive anterior tibial translation and limited hip flexion. With cueing she is able to correct without reports of anterior knee pain/pressure.    PT Frequency --   1-2x/week   PT Duration --   6-8 weeks   PT Treatment/Interventions ADLs/Self Care Home Management;Aquatic Therapy;Cryotherapy;Electrical Stimulation;Moist Heat;Gait training;Stair training;Functional mobility training;Therapeutic activities;Therapeutic exercise;Balance training;Neuromuscular re-education;Patient/family education;Manual techniques;Passive range of motion;Dry needling;Taping;Vasopneumatic Device    PT Next Visit Plan progress note    PT Home Exercise Plan Access Code: [email or web address]    Consulted and Agree with Plan of Care Patient             Patient will benefit from  skilled therapeutic intervention in order to improve the following deficits and impairments:  Abnormal gait, Decreased range of motion, Difficulty walking, Decreased activity tolerance, Pain, Improper body mechanics, Decreased balance, Decreased strength  Visit  Diagnosis: Stiffness of left knee, not elsewhere classified  Acute pain of left knee  Muscle weakness (generalized)  Difficulty in walking, not elsewhere classified     Problem List Patient Active Problem List   Diagnosis Date Noted   Tibia fracture 03/17/2021   Status post fall 03/16/2021   Closed fracture of tibial plateau    Supervision of normal pregnancy 09/28/2020   History of pre-eclampsia 11/29/2019   Letitia Libra, PT, DPT, ATC 06/12/21 3:46 PM  [facility] [address] [place], [place], [address] Phone: [phone]   Fax:  [phone]  Name: Alexandra Cole MRN: [id number] Date of Birth: Jul 11, 2001

## 2021-06-14 ENCOUNTER — Ambulatory Visit: Payer: Medicaid Other

## 2021-06-14 ENCOUNTER — Other Ambulatory Visit: Payer: Self-pay

## 2021-06-14 DIAGNOSIS — M25662 Stiffness of left knee, not elsewhere classified: Secondary | ICD-10-CM | POA: Diagnosis not present

## 2021-06-14 DIAGNOSIS — R262 Difficulty in walking, not elsewhere classified: Secondary | ICD-10-CM

## 2021-06-14 DIAGNOSIS — M25562 Pain in left knee: Secondary | ICD-10-CM

## 2021-06-14 DIAGNOSIS — M6281 Muscle weakness (generalized): Secondary | ICD-10-CM

## 2021-06-14 NOTE — Therapy (Signed)
[place], [place], [address] Phone: [phone]   Fax:  [phone]  Physical Therapy Treatment/Discharge  Patient Details  Name: Alexandra Cole MRN: [id number] Date of Birth: Sep 12, 2000 Referring Provider (PT): Leandrew Koyanagi, MD   Encounter Date: 06/14/2021   PT End of Session - 06/14/21 1446     Visit Number 9    Number of Visits 17    Date for PT Re-Evaluation 06/30/21    Authorization Type [employer]    Authorization Time Period 8 visits approved from 05/10/2021-07/09/2021    Authorization - Visit Number 8    Authorization - Number of Visits 8    PT Start Time 2703    PT Stop Time 1500    PT Time Calculation (min) 14 min    Activity Tolerance Patient tolerated treatment well    Behavior During Therapy [facility] for tasks assessed/performed             History reviewed. No pertinent past medical history.  Past Surgical History:  Procedure Laterality Date   ORIF TIBIA PLATEAU Left 03/16/2021   Procedure: OPEN REDUCTION INTERNAL FIXATION (ORIF) TIBIAL PLATEAU;  Surgeon: Leandrew Koyanagi, MD;  Location: [place];  Service: Orthopedics;  Laterality: Left;    There were no vitals filed for this visit.   Subjective Assessment - 06/14/21 1449     Subjective Patient reports her knee is feeling good without reports of pain. She reports no difficulty with daily activity and feels that she is ready for discharge. She is confident in continuing her HEP and does not have any issues performing them at this time.    How long can you sit comfortably? sitting is fine    How long can you stand comfortably? standing is fine    How long can you walk comfortably? no issues with walking, unsure of time frame.    Currently in Pain? No/denies                [facility] PT Assessment - 06/14/21 0001       Squat   Comments WNL; pain free      AROM   Left Knee Extension 0    Left Knee Flexion 145       Strength   Right Hip Flexion 5/5    Left Hip Flexion 5/5    Right Knee Flexion 5/5    Right Knee Extension 5/5    Left Knee Flexion 5/5    Left Knee Extension 5/5      Ambulation/Gait   Ambulation/Gait Yes    Gait Comments WNL, pain free                 [facility] Adult PT Treatment/Exercise:  Therapeutic Exercise: - Reviewed all exercises as part of advanced home program.   Manual Therapy: - n/a  Neuromuscular re-ed: - n/a  Therapeutic Activity: - n/a  Self-care/Home Management: - Education on re-assessment findings, progress towards goals.  - D/C education.                     PT Education - 06/14/21 1512     Education Details see self-care    Person(s) Educated Patient    Methods Explanation    Comprehension Verbalized understanding              PT Short Term Goals - 06/05/21 1457       PT SHORT TERM GOAL #1   Title  Patient will be independent with initial HEP.    Baseline issued at eval    Status Achieved    Target Date 05/16/21      PT SHORT TERM GOAL #2   Title Patient will demonstrate at least 120 degrees of Lt knee flexion AROM to improve ease of sit<>stand transfer    Baseline 110    Status Achieved    Target Date 05/30/21      PT SHORT TERM GOAL #3   Title Patient will demonstrate full Lt knee extension AROM to improve gait mechanics.    Baseline lacking 8    Status Achieved    Target Date 05/30/21      PT SHORT TERM GOAL #4   Title Patient will complete 5xSTS in less than 12 seconds to signify improvements in functional strength    Baseline 14.3    Status Achieved    Target Date 05/30/21               PT Long Term Goals - 06/14/21 1451       PT LONG TERM GOAL #1   Title Patient will demonstrate normalized gait mechanics without use of brace.    Baseline antalgic with hinge brace    Status Achieved      PT LONG TERM GOAL #2   Title Patient will be able to ascend/descend stairs utilizing reciprocal  pattern.    Baseline step to pattern    Status Achieved      PT LONG TERM GOAL #3   Title Patient will demonstrate at least 4+/5 strength in assessed Lt hip and knee musculature to improve stability with prolonged walking.    Baseline see flowsheet    Status Achieved      PT LONG TERM GOAL #4   Title Patient will demonstrate normal squat mechanics without reports of pain.    Baseline valgus collapse, limited squat depth; fearful of full squat currently.    Status Achieved                   Plan - 06/14/21 1459     Clinical Impression Statement Patient has made excellent functional progress since the start of care having met all established functional goals. She demonstrates full and pain free strength about the left hip and knee. She has normalized Lt knee AROM achieving 145 degrees of flexion and 0 degrees of extension. She has normal gait mechanics and demonstrate excellent form with squatting without reports of pain. She is independent with her advanced HEP. She is therefore appropriate for discharge at this time.    PT Treatment/Interventions ADLs/Self Care Home Management;Aquatic Therapy;Cryotherapy;Electrical Stimulation;Moist Heat;Gait training;Stair training;Functional mobility training;Therapeutic activities;Therapeutic exercise;Balance training;Neuromuscular re-education;Patient/family education;Manual techniques;Passive range of motion;Dry needling;Taping;Vasopneumatic Device    PT Home Exercise Plan Access Code: [email or web address]    Consulted and Agree with Plan of Care Patient             Patient will benefit from skilled therapeutic intervention in order to improve the following deficits and impairments:  Abnormal gait, Decreased range of motion, Difficulty walking, Decreased activity tolerance, Pain, Improper body mechanics, Decreased balance, Decreased strength  Visit Diagnosis: Stiffness of left knee, not elsewhere classified  Acute pain of left knee  Muscle  weakness (generalized)  Difficulty in walking, not elsewhere classified     Problem List Patient Active Problem List   Diagnosis Date Noted   Tibia fracture 03/17/2021   Status post fall 03/16/2021   Closed fracture of tibial  plateau    Supervision of normal pregnancy 09/28/2020   History of pre-eclampsia 11/29/2019   PHYSICAL THERAPY DISCHARGE SUMMARY  Visits from Start of Care: 9  Current functional level related to goals / functional outcomes: See goals above   Remaining deficits: N/A   Education / Equipment: See self-care above    Patient agrees to discharge. Patient goals were met. Patient is being discharged due to meeting the stated rehab goals. Gwendolyn Grant, PT, DPT, ATC 06/14/21 3:14 PM   [facility] [address] [place], [place], [address] Phone: [phone]   Fax:  [phone]  Name: Alexandra Cole MRN: [id number] Date of Birth: 2001/05/28

## 2021-06-19 ENCOUNTER — Ambulatory Visit: Payer: Medicaid Other

## 2021-06-22 ENCOUNTER — Ambulatory Visit (INDEPENDENT_AMBULATORY_CARE_PROVIDER_SITE_OTHER): Payer: Medicaid Other | Admitting: Orthopaedic Surgery

## 2021-06-22 ENCOUNTER — Encounter: Payer: Self-pay | Admitting: Orthopaedic Surgery

## 2021-06-22 ENCOUNTER — Ambulatory Visit: Payer: Self-pay

## 2021-06-22 ENCOUNTER — Other Ambulatory Visit: Payer: Self-pay

## 2021-06-22 DIAGNOSIS — S82142A Displaced bicondylar fracture of left tibia, initial encounter for closed fracture: Secondary | ICD-10-CM

## 2021-06-22 DIAGNOSIS — M25562 Pain in left knee: Secondary | ICD-10-CM | POA: Diagnosis not present

## 2021-06-22 DIAGNOSIS — G8929 Other chronic pain: Secondary | ICD-10-CM

## 2021-06-22 NOTE — Progress Notes (Signed)
   Post-Op Visit Note   Patient: Alexandra Cole           Date of Birth: Dec 13, 2000           MRN: [id number] Visit Date: 06/22/2021 PCP: Pcp, No   Assessment & Plan:  Chief Complaint:  Chief Complaint  Patient presents with   Left Knee - Pain   Visit Diagnoses:  1. Chronic pain of left knee   2. Closed fracture of left tibial plateau, initial encounter     Plan: Patient is little over 3 months status post ORIF left lateral tibial plateau fracture.  She has some discomfort at times mainly has some prominence of the hardware around the knee but truly no complaints.  Finished physical therapy last week.  Left knee shows fully healed surgical scar.  She has excellent range of motion lacks couple degrees in full extension a couple degrees and full flexion.  Stable to varus valgus.  Her fracture is now demonstrated complete healing.  The plate is likely a little prominent on her since she is quite thin but she understands that usually its not bothersome enough to warrant hardware removal.  She is released to activity as tolerated at this time.  Follow-up as needed.  Follow-Up Instructions: No follow-ups on file.   Orders:  Orders Placed This Encounter  Procedures   XR Knee 1-2 Views Left   No orders of the defined types were placed in this encounter.   Imaging: XR Knee 1-2 Views Left  Result Date: 06/22/2021 Healed lateral tibial plateau fracture.  No complications.   PMFS History: Patient Active Problem List   Diagnosis Date Noted   Tibia fracture 03/17/2021   Status post fall 03/16/2021   Closed fracture of tibial plateau    Supervision of normal pregnancy 09/28/2020   History of pre-eclampsia 11/29/2019   History reviewed. No pertinent past medical history.  History reviewed. No pertinent family history.  Past Surgical History:  Procedure Laterality Date   ORIF TIBIA PLATEAU Left 03/16/2021   Procedure: OPEN REDUCTION INTERNAL FIXATION (ORIF) TIBIAL  PLATEAU;  Surgeon: Tarry Kos, MD;  Location: [facility];  Service: Orthopedics;  Laterality: Left;   Social History   Occupational History   Not on file  Tobacco Use   Smoking status: Never   Smokeless tobacco: Never  Vaping Use   Vaping Use: Never used  Substance and Sexual Activity   Alcohol use: Not Currently   Drug use: Not Currently   Sexual activity: Not on file

## 2022-07-31 IMAGING — CT CT KNEE*L* W/O CM
3 of 4 series · 12 of 33 positions shown, 14 images · non-contrast
Comparison: 03/16/2021 RADIOGRAPHS

CLINICAL DATA: Left knee fracture.

EXAM:
CT OF THE LEFT KNEE WITHOUT CONTRAST
TECHNIQUE: Multidetector CT imaging of the LEFT knee was performed according to
the standard protocol. Multiplanar CT image reconstructions were
also generated.

[Series 5: extremity soft tissue · axial · 0.36mm/px · z∈[+496,+626]mm · 4 of 95 slices shown, 5 images]
[im 15/95  soft-tissue]
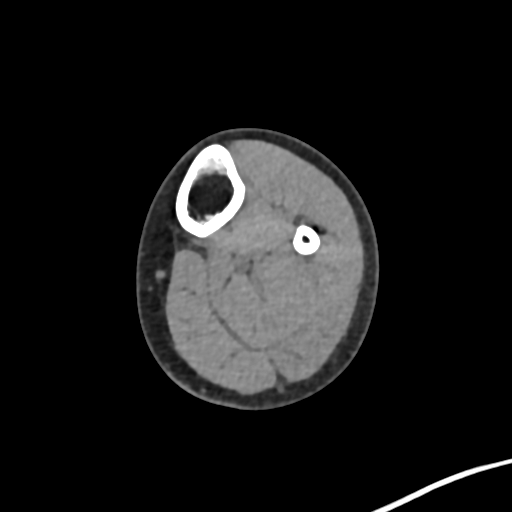
[im 15/95  bone]
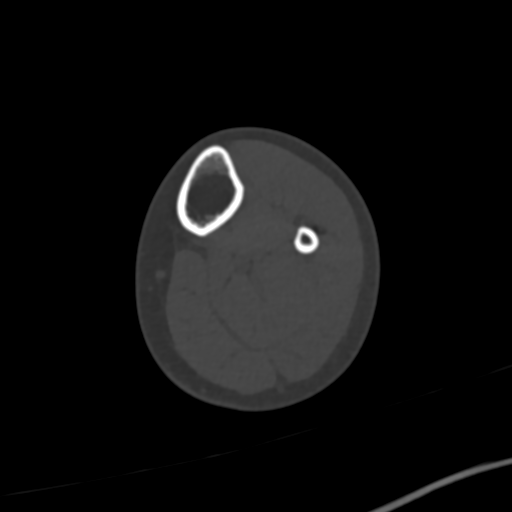
[im 37/95  bone]
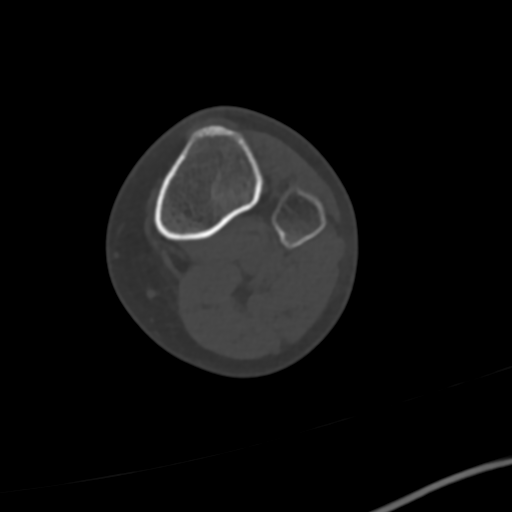
[im 58/95  bone]
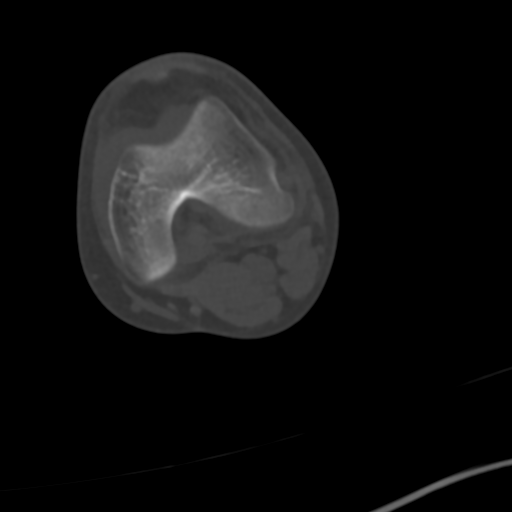
[im 80/95  bone]
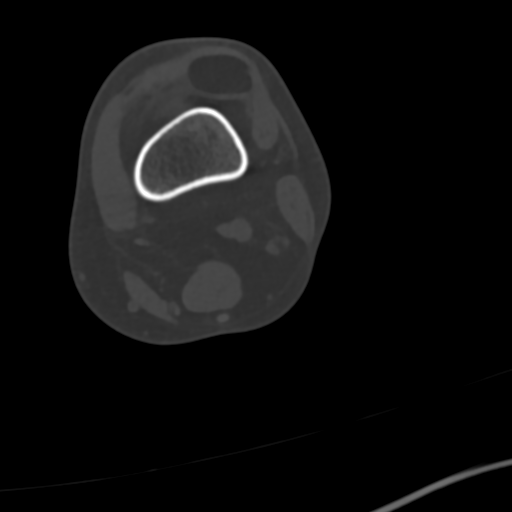

[Series 8: sag bone · sagittal · 0.29mm/px · 5 of 63 slices shown, 6 images]
[im 11/63  soft-tissue]
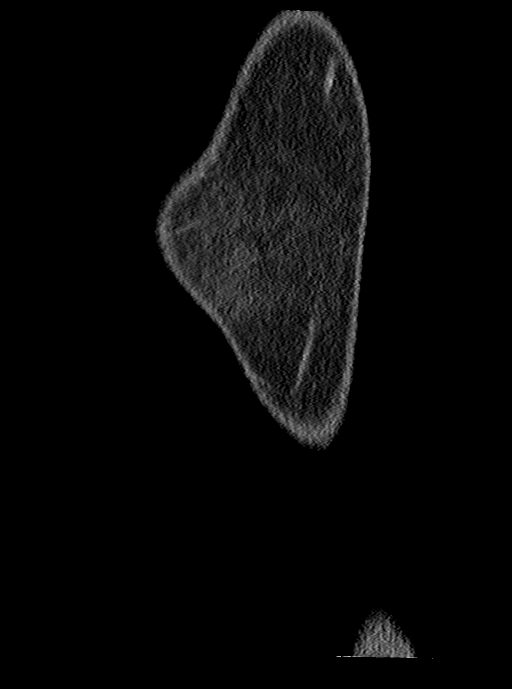
[im 11/63  bone]
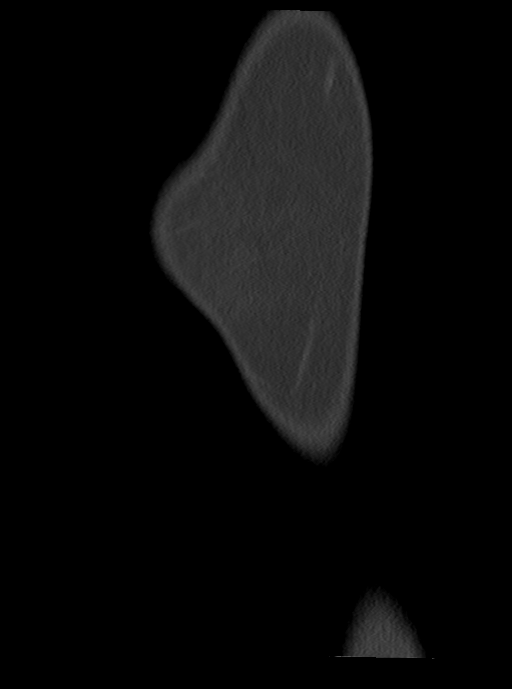
[im 21/63  bone]
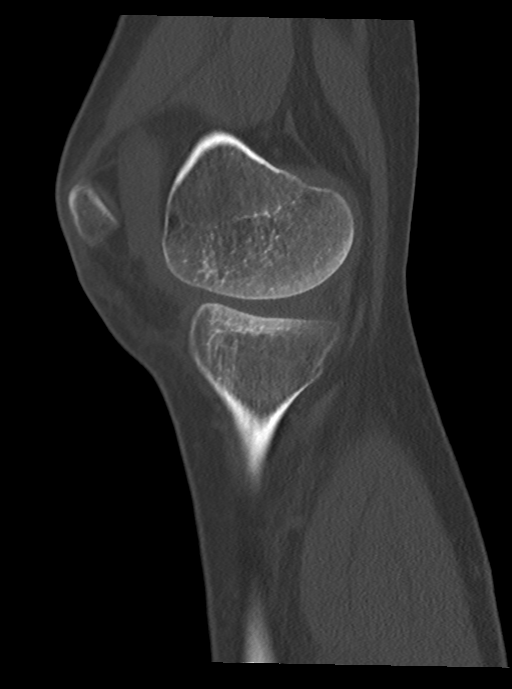
[im 32/63  bone]
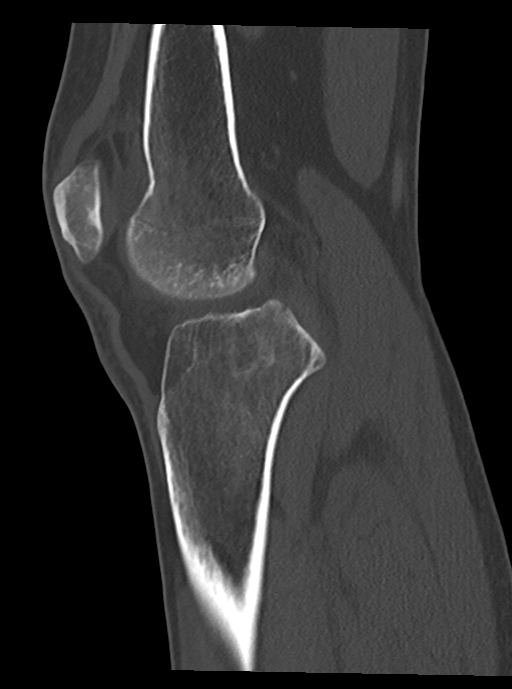
[im 42/63  bone]
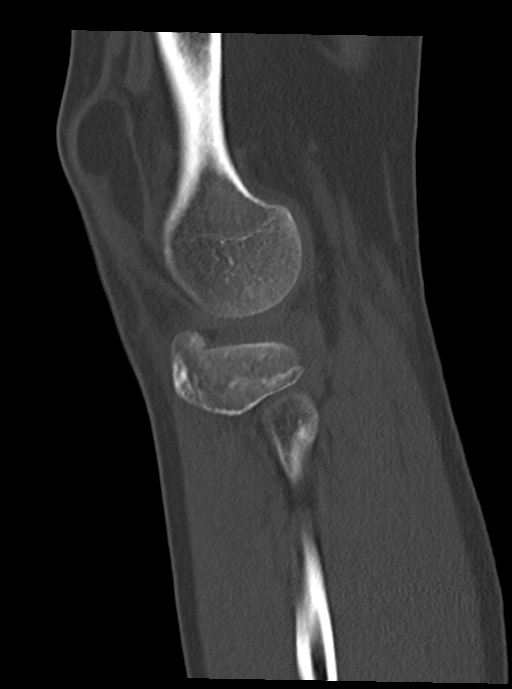
[im 52/63  bone]
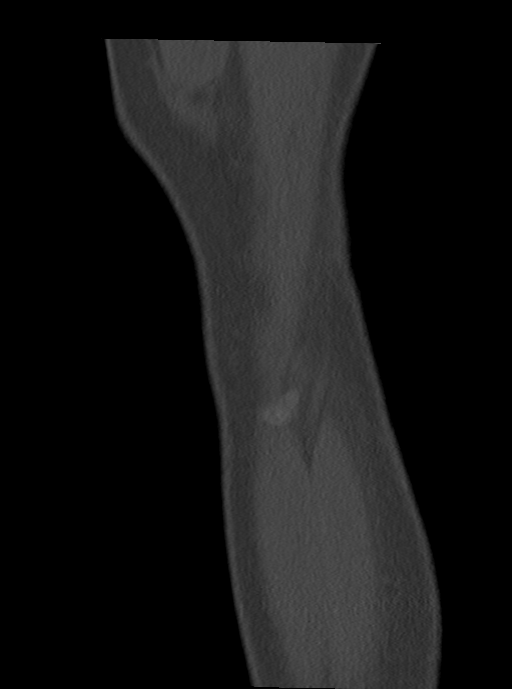

[Series 9: cor soft tissue · coronal · 0.23mm/px · 3 of 76 slices shown]
[im 16/76  bone]
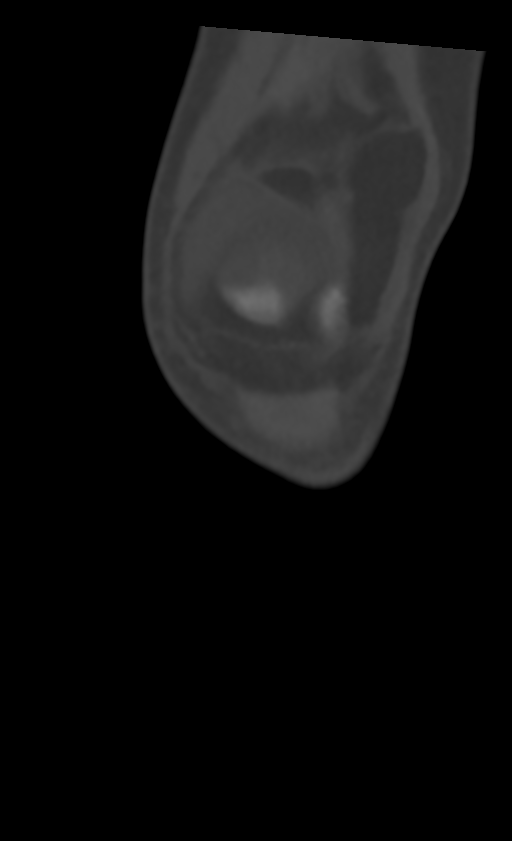
[im 31/76  bone]
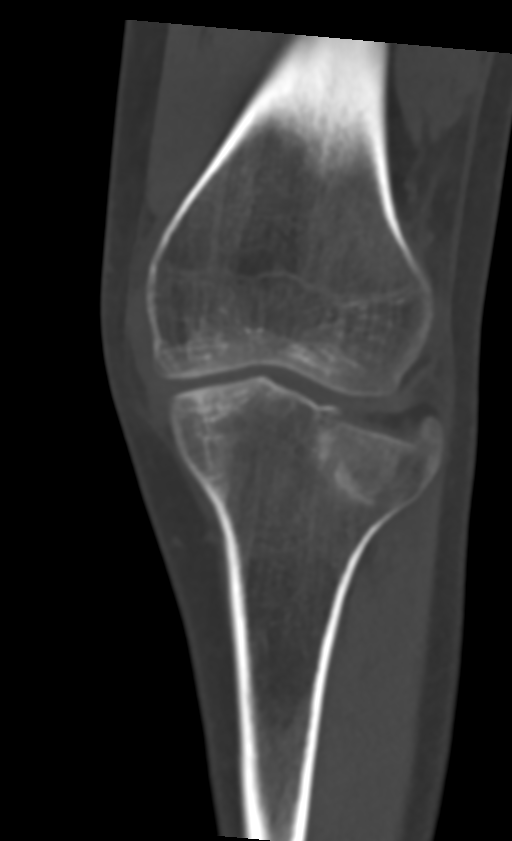
[im 46/76  bone]
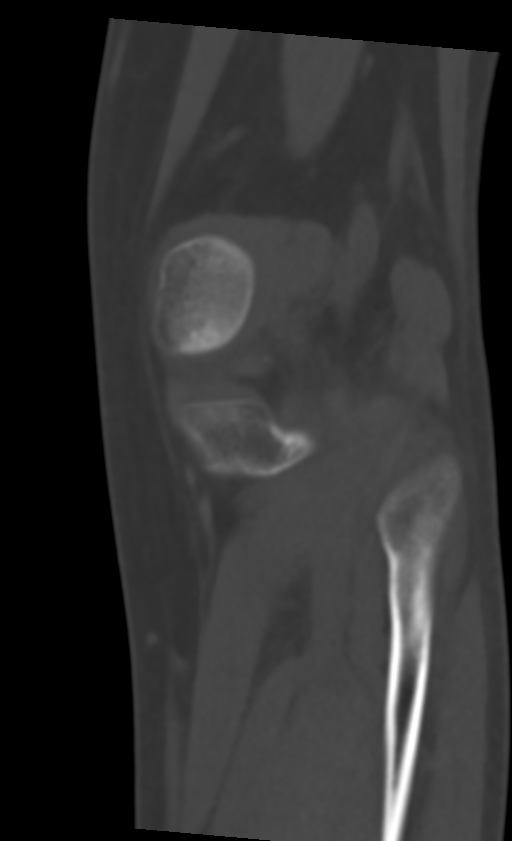

[12 of 33 positions shown; findings below may reference images not displayed]

FINDINGS: Bones/Joint/Cartilage

There is a split depression fracture of the lateral tibial plateau
with up to 4 mm of articular surface step-off along the lateral
fracture margin. Fracture line extends into the intercondylar
eminence. The split fracture component is seen extending into the
proximal tibiofibular joint.

Additional subcortical impaction fracture of the medial tibial
plateau is noted as well.

Distal femur is intact.

Lipohemarthrosis is noted.

Ligaments

Suboptimally assessed by CT. Thickening and hazy stranding about the
ACL likely reflecting some degree of tearing. PCL appears intact
though the tibial attachment site is closely approximated by the
tibial plateau fracture line. Thickening, stranding and laxity along
the lateral supporting structures. Medial supporting structures
appear intact with some minimal bowing secondary to fluid in the
medial recess.

Muscles and Tendons

Anterior bowing of the extensor mechanism secondary to the presence
of the effusion and lipohemarthrosis. Extensor mechanism otherwise
remains grossly intact. No discontinuity along the medial or lateral
retinaculum. No other conspicuous musculotendinous abnormalities are
seen.

Soft tissues

Soft tissue swelling of the knee. Effusion lipohemarthrosis as
above.
IMPRESSION: Split depressed fracture of the lateral tibial plateau with
extension into the intercondylar eminence and fracture line
extension into the proximal tibiofibular joint.

Additional subcortical impaction fracture of the medial tibial
plateau.

Lipohemarthrosis.

Thickening, laxity of the lateral supporting structures of the knee
concerning for ligamentous injury. Additional stranding and
irregularity of the ACL.

PCL appears intact though the tibial attachment is closely
approximated by the fracture line.

## 2022-07-31 IMAGING — CR DG KNEE 1-2V*L*
2 series · 2 of 2 positions shown · non-contrast
Comparison: Tibia and femur radiograph earlier today.

CLINICAL DATA: Fall this evening.  Left knee pain.

EXAM:
LEFT KNEE - 1-2 VIEW

[knee ap]
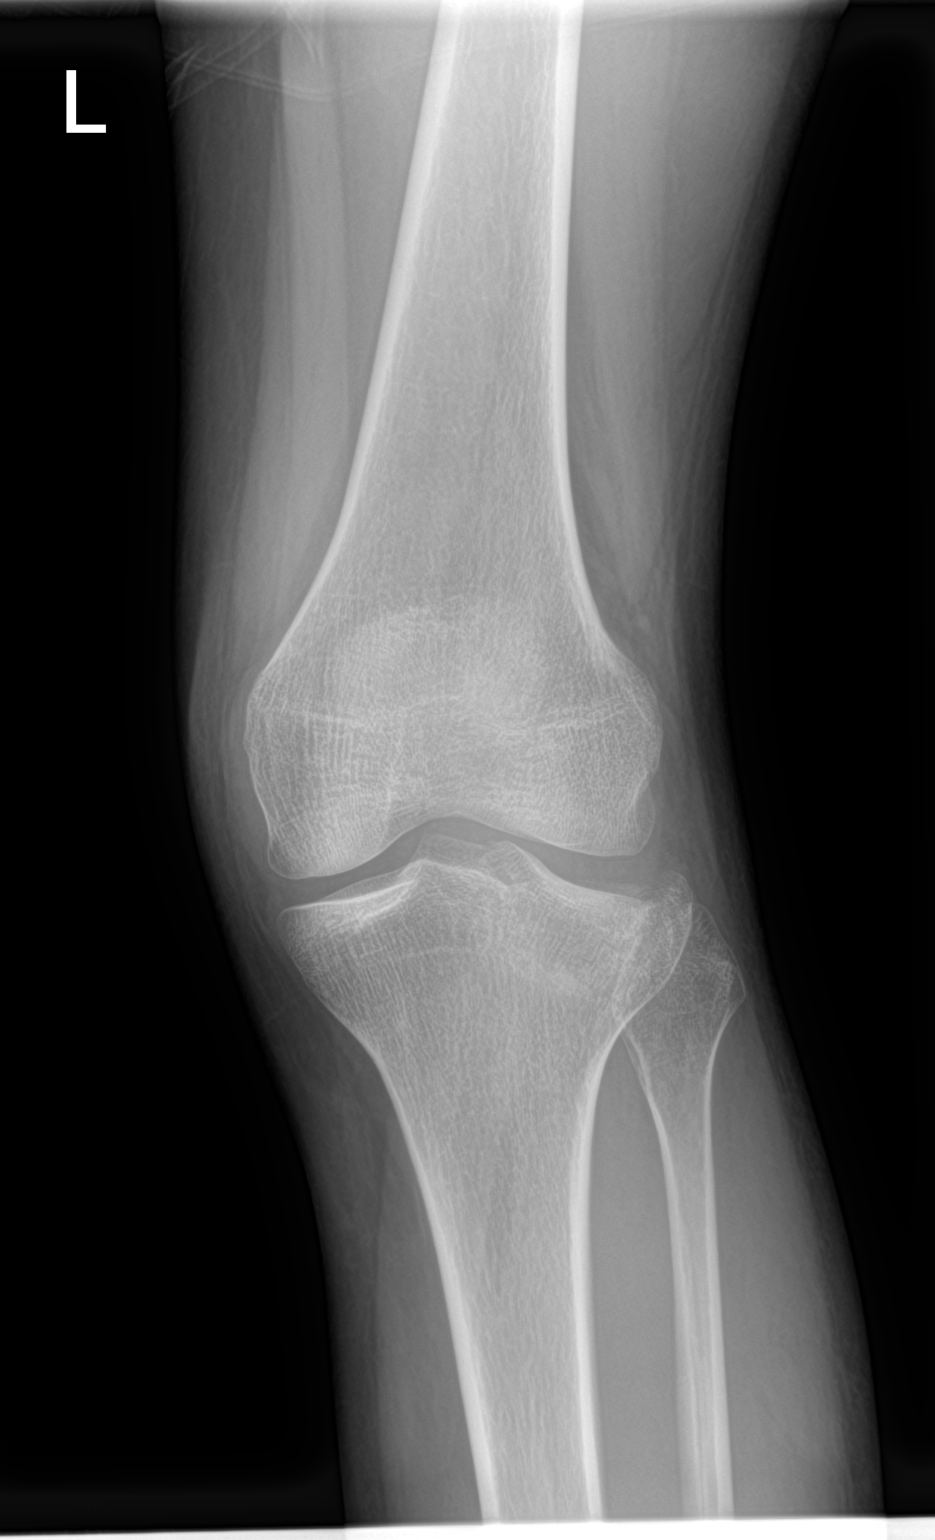

[knee lat]
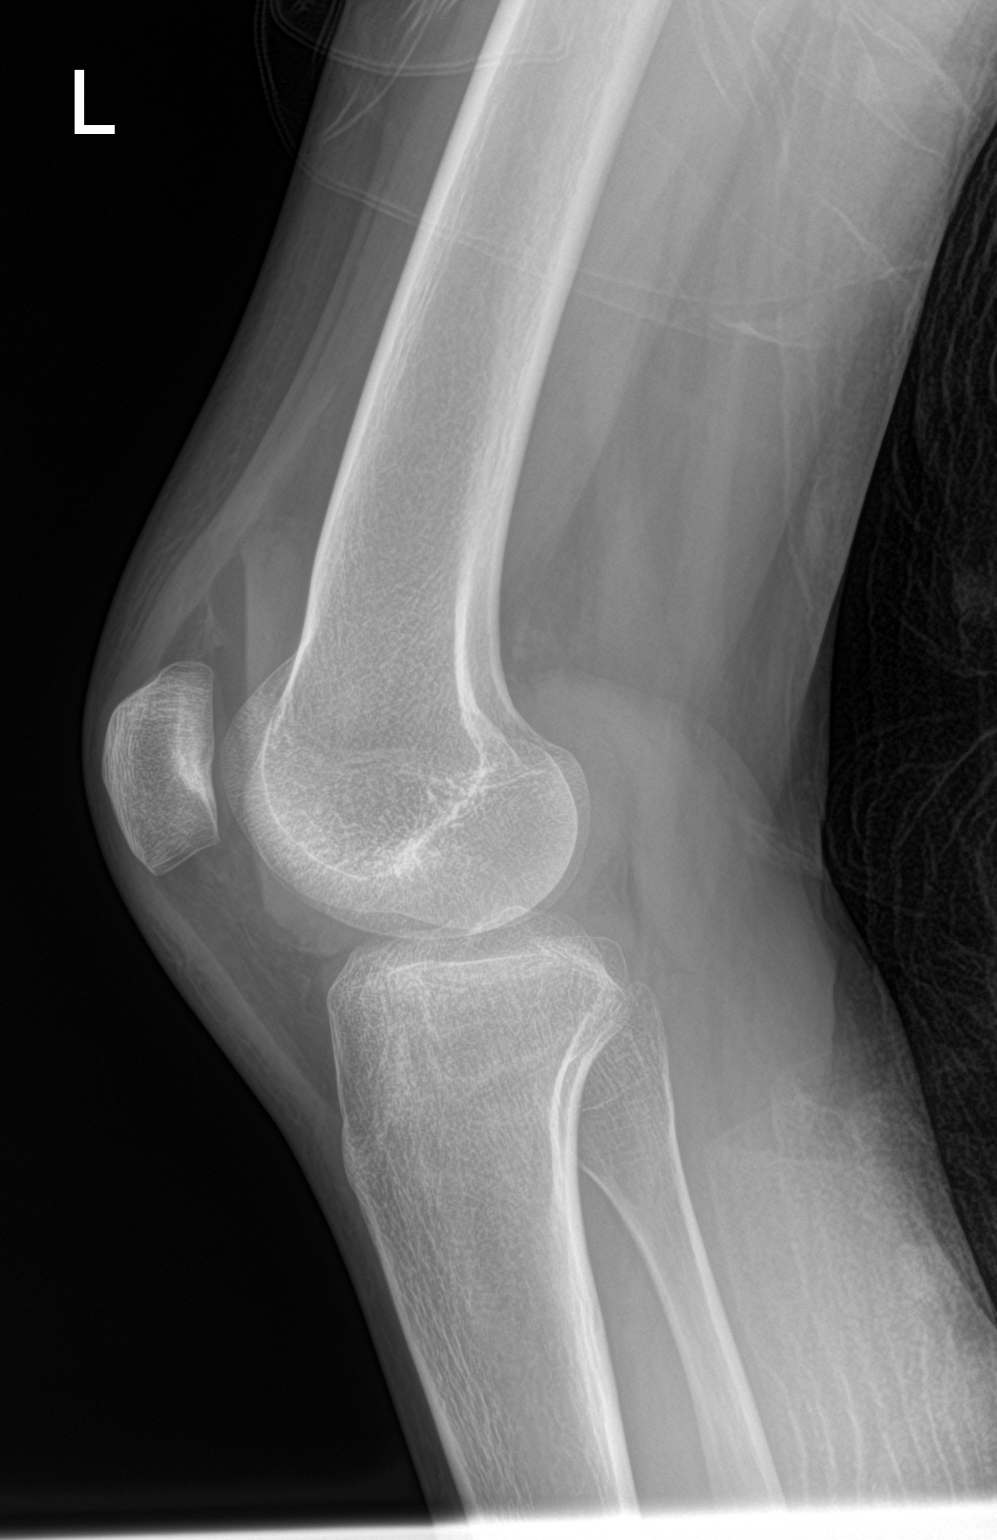

[2 of 2 positions shown; findings below may reference images not displayed]

FINDINGS: Again seen depressed fracture of the lateral tibial plateau.
Approximately 4-5 mm articular depression laterally. Fracture
extends to the tibial spines. No convincing metaphyseal component.
There is a large lipohemarthrosis.
IMPRESSION: Depressed lateral tibial plateau fracture with approximately 4-5 mm
articular depression.

## 2022-07-31 IMAGING — RF DG C-ARM 1-60 MIN
1 series · 5 of 5 positions shown · non-contrast
Comparison: Preoperative imaging.

CLINICAL DATA: Open reduction internal fixation left tibial
plateau.

EXAM:
LEFT KNEE - COMPLETE 4+ VIEW; DG C-ARM 1-60 MIN

[Series 1: run · 5 of 5 slices shown]
[im 1/5]
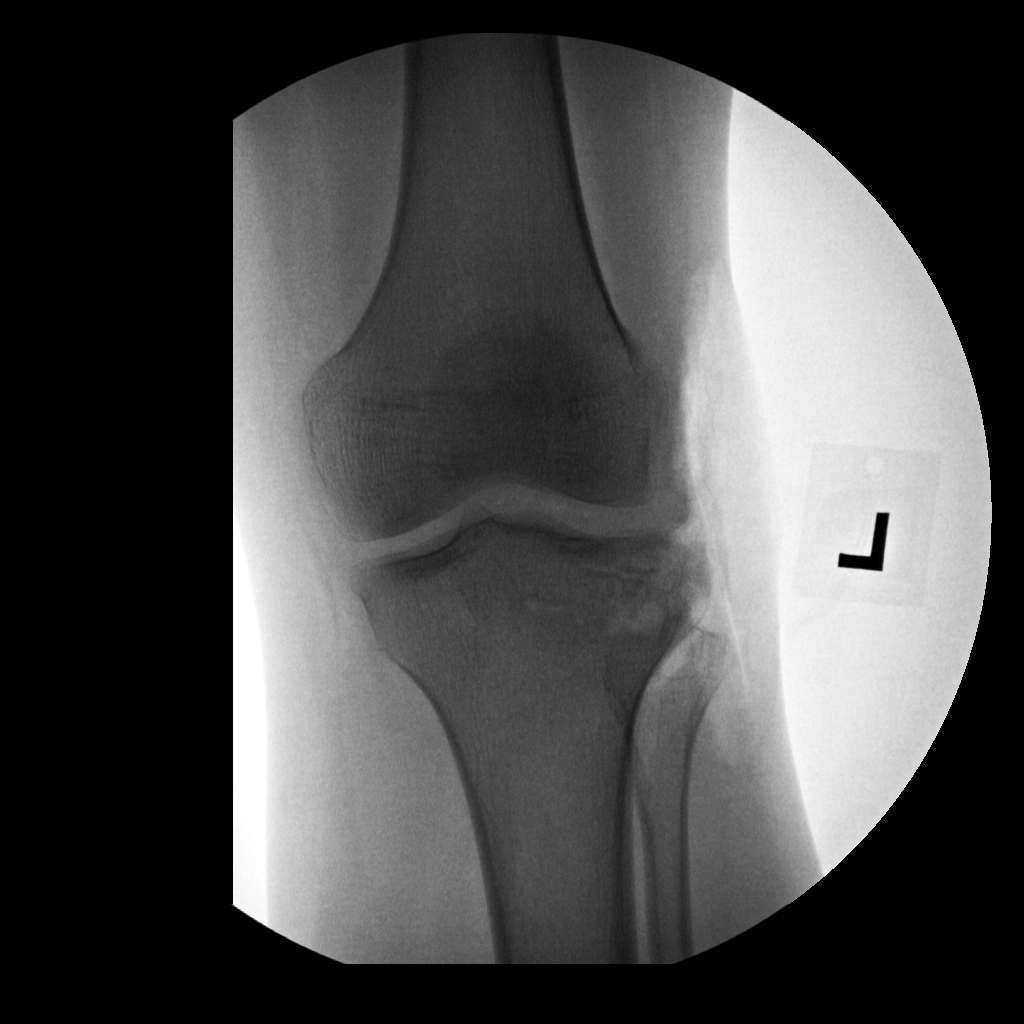
[im 2/5]
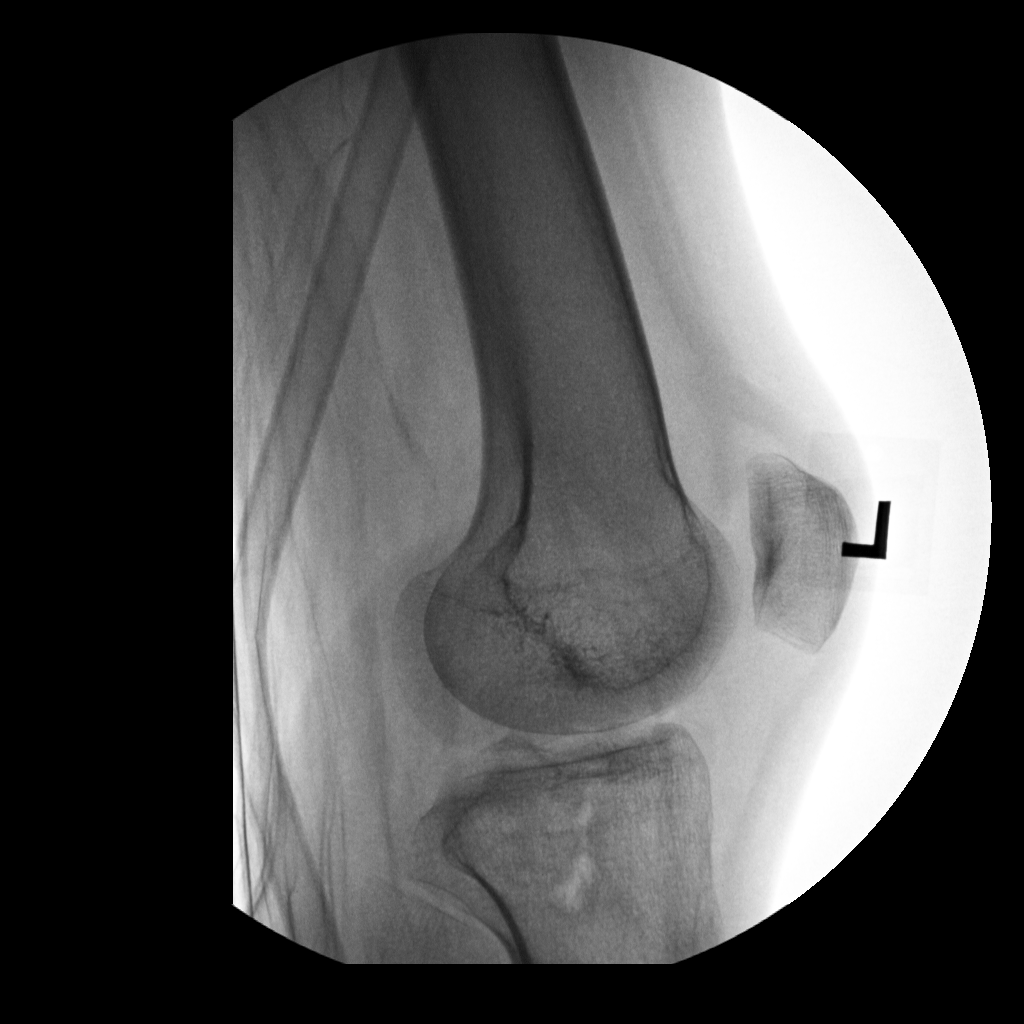
[im 3/5]
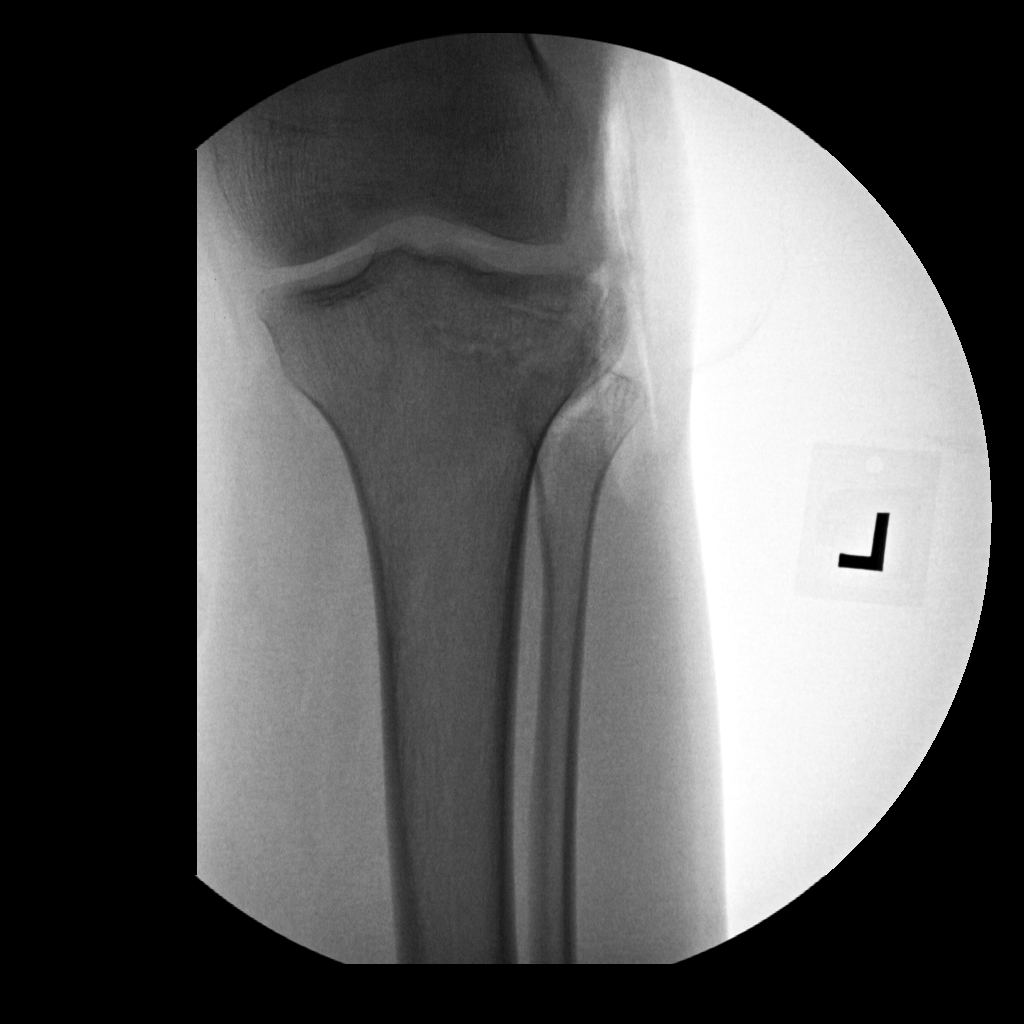
[im 4/5]
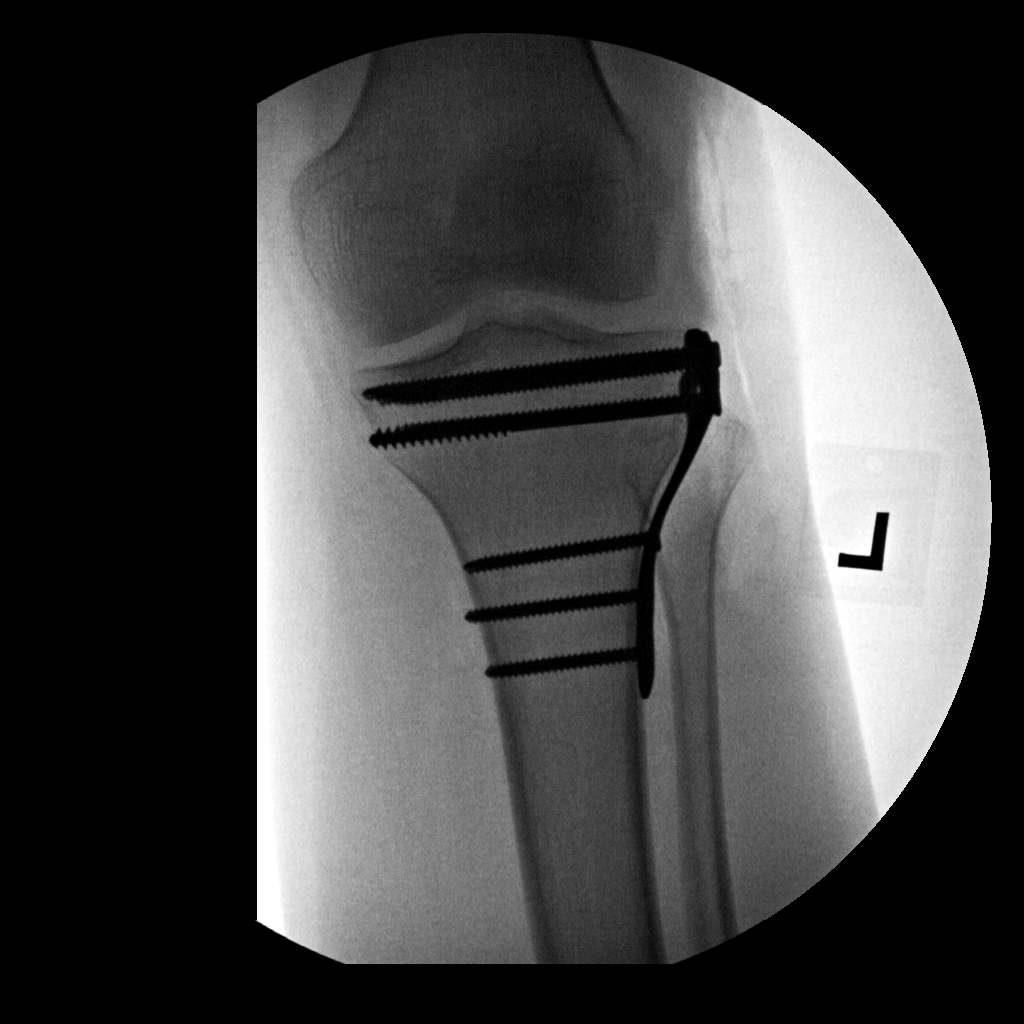
[im 5/5]
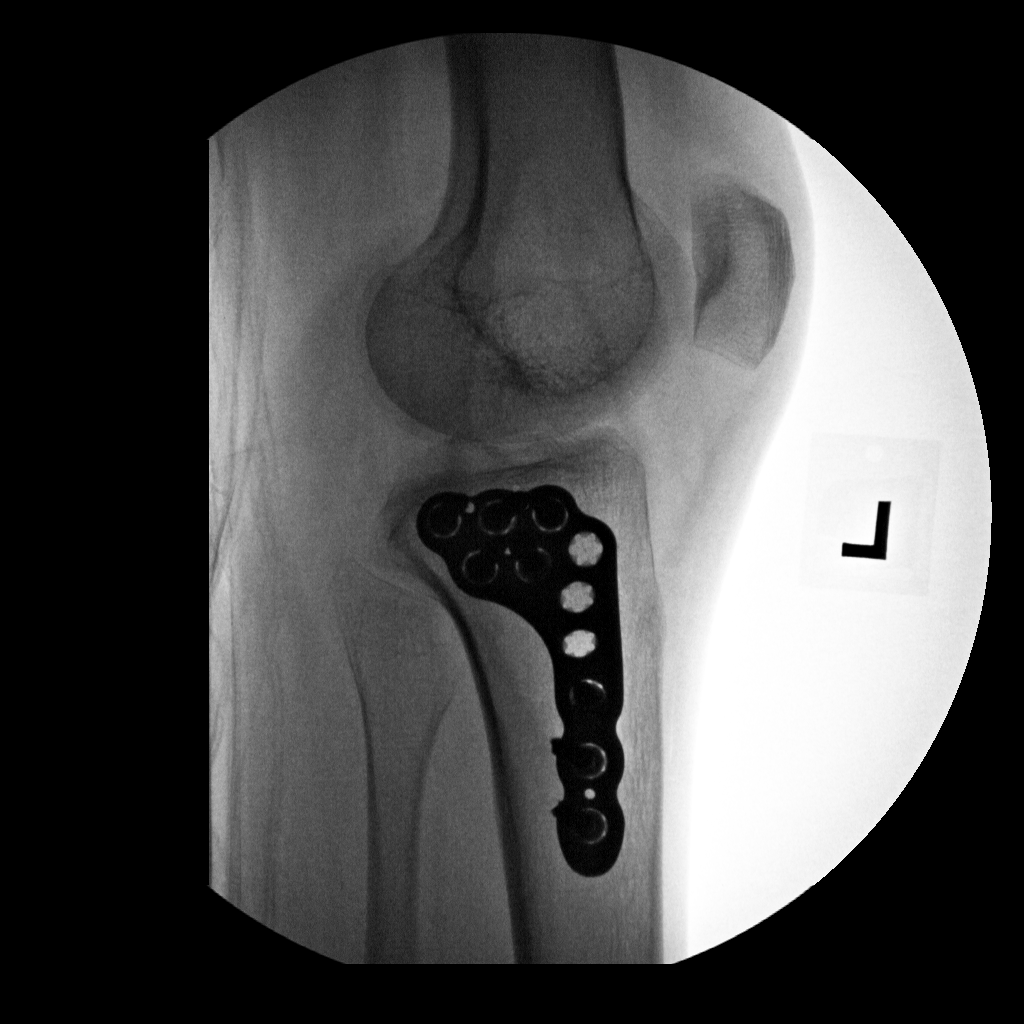

[5 of 5 positions shown; findings below may reference images not displayed]

FINDINGS: Five fluoroscopic spot views of the left knee obtained in the
operating room. Lateral plate and screw fixation of tibial plateau
fracture. Fluoroscopy time 1 minutes 6 seconds. Dose 1.98 mGy.
IMPRESSION: Intraoperative fluoroscopy during left knee fracture ORIF.
